# Patient Record
Sex: Female | Born: 1945 | Race: White | Hispanic: No | Marital: Married | Smoking: Never smoker
Health system: Southern US, Community
[De-identification: ages and names within clinical notes are randomized; demographics above are authoritative.]

## PROBLEM LIST (undated history)

## (undated) DIAGNOSIS — Z800 Family history of malignant neoplasm of digestive organs: Secondary | ICD-10-CM

## (undated) DIAGNOSIS — M1990 Unspecified osteoarthritis, unspecified site: Secondary | ICD-10-CM

## (undated) DIAGNOSIS — T7840XA Allergy, unspecified, initial encounter: Secondary | ICD-10-CM

## (undated) DIAGNOSIS — F419 Anxiety disorder, unspecified: Secondary | ICD-10-CM

## (undated) DIAGNOSIS — I10 Essential (primary) hypertension: Secondary | ICD-10-CM

## (undated) DIAGNOSIS — K635 Polyp of colon: Secondary | ICD-10-CM

## (undated) DIAGNOSIS — K5792 Diverticulitis of intestine, part unspecified, without perforation or abscess without bleeding: Secondary | ICD-10-CM

## (undated) DIAGNOSIS — G4700 Insomnia, unspecified: Secondary | ICD-10-CM

## (undated) HISTORY — DX: Family history of malignant neoplasm of digestive organs: Z80.0

## (undated) HISTORY — PX: TUBAL LIGATION: SHX77

## (undated) HISTORY — PX: COLONOSCOPY: SHX174

## (undated) HISTORY — DX: Diverticulitis of intestine, part unspecified, without perforation or abscess without bleeding: K57.92

## (undated) HISTORY — DX: Polyp of colon: K63.5

## (undated) HISTORY — DX: Allergy, unspecified, initial encounter: T78.40XA

## (undated) HISTORY — DX: Unspecified osteoarthritis, unspecified site: M19.90

---

## 1999-06-24 DIAGNOSIS — K635 Polyp of colon: Secondary | ICD-10-CM

## 1999-06-24 HISTORY — DX: Polyp of colon: K63.5

## 2005-03-24 ENCOUNTER — Ambulatory Visit: Payer: Self-pay | Admitting: Internal Medicine

## 2006-04-09 ENCOUNTER — Ambulatory Visit: Payer: Self-pay | Admitting: Internal Medicine

## 2006-05-07 ENCOUNTER — Ambulatory Visit: Payer: Self-pay | Admitting: Internal Medicine

## 2007-06-24 DIAGNOSIS — K5792 Diverticulitis of intestine, part unspecified, without perforation or abscess without bleeding: Secondary | ICD-10-CM

## 2007-06-24 HISTORY — DX: Diverticulitis of intestine, part unspecified, without perforation or abscess without bleeding: K57.92

## 2007-08-09 ENCOUNTER — Ambulatory Visit: Payer: Self-pay | Admitting: Orthopedic Surgery

## 2007-09-07 ENCOUNTER — Ambulatory Visit: Payer: Self-pay | Admitting: Internal Medicine

## 2007-09-23 ENCOUNTER — Ambulatory Visit: Payer: Self-pay | Admitting: General Surgery

## 2008-09-12 ENCOUNTER — Ambulatory Visit: Payer: Self-pay | Admitting: Internal Medicine

## 2009-10-11 ENCOUNTER — Ambulatory Visit: Payer: Self-pay | Admitting: Internal Medicine

## 2010-10-22 ENCOUNTER — Ambulatory Visit: Payer: Self-pay | Admitting: Internal Medicine

## 2011-12-18 ENCOUNTER — Ambulatory Visit: Payer: Self-pay | Admitting: Internal Medicine

## 2012-11-30 ENCOUNTER — Encounter: Payer: Self-pay | Admitting: General Surgery

## 2012-12-06 ENCOUNTER — Ambulatory Visit (INDEPENDENT_AMBULATORY_CARE_PROVIDER_SITE_OTHER): Payer: Medicare Other | Admitting: General Surgery

## 2012-12-06 ENCOUNTER — Encounter: Payer: Self-pay | Admitting: General Surgery

## 2012-12-06 VITALS — BP 140/80 | HR 74 | Resp 14 | Ht 63.0 in | Wt 207.0 lb

## 2012-12-06 DIAGNOSIS — Z1211 Encounter for screening for malignant neoplasm of colon: Secondary | ICD-10-CM

## 2012-12-06 DIAGNOSIS — Z800 Family history of malignant neoplasm of digestive organs: Secondary | ICD-10-CM

## 2012-12-06 MED ORDER — POLYETHYLENE GLYCOL 3350 17 GM/SCOOP PO POWD: ORAL | Status: DC

## 2012-12-06 NOTE — Patient Instructions (Addendum)
Colonoscopy A colonoscopy is an exam to evaluate your entire colon. In this exam, your colon is cleansed. A long fiberoptic tube is inserted through your rectum and into your colon. The fiberoptic scope (endoscope) is a long bundle of enclosed and very flexible fibers. These fibers transmit light to the area examined and send images from that area to your caregiver. Discomfort is usually minimal. You may be given a drug to help you sleep (sedative) during or prior to the procedure. This exam helps to detect lumps (tumors), polyps, inflammation, and areas of bleeding. Your caregiver may also take a small piece of tissue (biopsy) that will be examined under a microscope. LET YOUR CAREGIVER KNOW ABOUT:   Allergies to food or medicine.  Medicines taken, including vitamins, herbs, eyedrops, over-the-counter medicines, and creams.  Use of steroids (by mouth or creams).  Previous problems with anesthetics or numbing medicines.  History of bleeding problems or blood clots.  Previous surgery.  Other health problems, including diabetes and kidney problems.  Possibility of pregnancy, if this applies. BEFORE THE PROCEDURE   A clear liquid diet may be required for 2 days before the exam.  Ask your caregiver about changing or stopping your regular medications.  Liquid injections (enemas) or laxatives may be required.  A large amount of electrolyte solution may be given to you to drink over a short period of time. This solution is used to clean out your colon.  You should be present 60 minutes prior to your procedure or as directed by your caregiver. AFTER THE PROCEDURE   If you received a sedative or pain relieving medication, you will need to arrange for someone to drive you home.  Occasionally, there is a little blood passed with the first bowel movement. Do not be concerned. FINDING OUT THE RESULTS OF YOUR TEST Not all test results are available during your visit. If your test results are  not back during the visit, make an appointment with your caregiver to find out the results. Do not assume everything is normal if you have not heard from your caregiver or the medical facility. It is important for you to follow up on all of your test results. HOME CARE INSTRUCTIONS   It is not unusual to pass moderate amounts of gas and experience mild abdominal cramping following the procedure. This is due to air being used to inflate your colon during the exam. Walking or a warm pack on your belly (abdomen) may help.  You may resume all normal meals and activities after sedatives and medicines have worn off.  Only take over-the-counter or prescription medicines for pain, discomfort, or fever as directed by your caregiver. Do not use aspirin or blood thinners if a biopsy was taken. Consult your caregiver for medicine usage if biopsies were taken. SEEK IMMEDIATE MEDICAL CARE IF:   You have a fever.  You pass large blood clots or fill a toilet with blood following the procedure. This may also occur 10 to 14 days following the procedure. This is more likely if a biopsy was taken.  You develop abdominal pain that keeps getting worse and cannot be relieved with medicine. Document Released: 06/06/2000 Document Revised: 09/01/2011 Document Reviewed: 01/20/2008 Exit[employer] Patient Information 2014 [employer].  Patient has been scheduled for a colonoscopy on 12-29-12 at [facility]. 

## 2012-12-06 NOTE — Progress Notes (Signed)
Patient ID: Michele Dean, female   DOB: 1946-04-24, 67 y.o.   MRN: [id number]  Chief Complaint  Patient presents with  . Other    evaluation for colonoscopy    HPI Michele Dean is a 67 y.o. female who presents for an evaluation for a colonoscopy. The patient's mother has a history of colon cancer. The patient's last colonoscopy was done in 2009 which showed diverticula of the sigmoid colon.  HPI  Past Medical History  Diagnosis Date  . Allergy   . Arthritis   . Diverticulitis 2009  . Family history of colon cancer     mother    Past Surgical History  Procedure Laterality Date  . Colonoscopy  2009    Family History  Problem Relation Age of Onset  . Cancer Mother     colon    Social History History  Substance Use Topics  . Smoking status: Never Smoker   . Smokeless tobacco: Never Used  . Alcohol Use: Yes    Not on File  Current Outpatient Prescriptions  Medication Sig Dispense Refill  . polyethylene glycol powder (GLYCOLAX/MIRALAX) powder 255 grams one bottle for colonoscopy prep  255 g  0  . sertraline (ZOLOFT) 50 MG tablet Take 1 tablet by mouth daily.       No current facility-administered medications for this visit.    Review of Systems Review of Systems  Constitutional: Negative.   Respiratory: Negative.   Cardiovascular: Negative.   Gastrointestinal: Negative.     Blood pressure 140/80, pulse 74, resp. rate 14, height 5\' 3"  (1.6 m), weight 207 lb (93.895 kg).  Physical Exam Physical Exam  Constitutional: She appears well-developed and well-nourished.  Cardiovascular: Normal rate, regular rhythm and normal heart sounds.   Pulmonary/Chest: Effort normal and breath sounds normal.  Abdominal: Bowel sounds are normal.  Lymphadenopathy:    She has no cervical adenopathy.  Neurological: She is alert.    Data Reviewed None    Assessment    Family history colon cancer      Plan    Colonoscopy     Patient has been scheduled for a colonoscopy  on 12-29-12 at [facility].   Michele Dean 12/07/2012, 9:04 PM

## 2012-12-07 ENCOUNTER — Encounter: Payer: Self-pay | Admitting: General Surgery

## 2012-12-07 ENCOUNTER — Other Ambulatory Visit: Payer: Self-pay | Admitting: General Surgery

## 2012-12-07 DIAGNOSIS — Z86010 Personal history of colonic polyps: Secondary | ICD-10-CM

## 2012-12-07 DIAGNOSIS — Z1211 Encounter for screening for malignant neoplasm of colon: Secondary | ICD-10-CM | POA: Insufficient documentation

## 2012-12-07 NOTE — Progress Notes (Signed)
Patient ID: Michele Dean, female   DOB: [birth year], 67 y.o.   MRN: [id number]  No chief complaint on file.   HPI Michele Dean is a 67 y.o. female returning for a followup colonoscopy.  HPI Comments: The patient is following up in regards to a colonoscopy do a personal history of adenomatous polyps and her mother's history of colon cancer.   Past Medical History  Diagnosis Date  . Allergy   . Arthritis   . Diverticulitis 2009  . Family history of colon cancer     mother    Past Surgical History  Procedure Laterality Date  . Colonoscopy  2009    Family History  Problem Relation Age of Onset  . Cancer Mother     colon    Social History History  Substance Use Topics  . Smoking status: Never Smoker   . Smokeless tobacco: Never Used  . Alcohol Use: Yes    Not on File  Current Outpatient Prescriptions  Medication Sig Dispense Refill  . polyethylene glycol powder (GLYCOLAX/MIRALAX) powder 255 grams one bottle for colonoscopy prep  255 g  0  . sertraline (ZOLOFT) 50 MG tablet Take 1 tablet by mouth daily.       No current facility-administered medications for this visit.    Review of Systems Review of Systems  Constitutional: Negative.   HENT: Negative.   Respiratory: Negative.   Cardiovascular: Negative.   Gastrointestinal: Negative.     There were no vitals taken for this visit.  Physical Exam Physical Exam  Constitutional: She appears well-developed and well-nourished.  Neck: Normal range of motion. Neck supple.  Cardiovascular: Normal rate and regular rhythm.   Pulmonary/Chest: Effort normal and breath sounds normal.    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  Assessment    Family history of colon cancer first degree relative.     Plan    Colonoscopy is scheduled for December 29, 2012. As she has had an incomplete exam in the past due to discomfort, examination under propofol anesthesia is felt  most appropriate.        Earline Mayotte 12/07/2012, 8:38 PM

## 2012-12-20 ENCOUNTER — Ambulatory Visit: Payer: Self-pay | Admitting: Internal Medicine

## 2012-12-21 ENCOUNTER — Telehealth: Payer: Self-pay | Admitting: *Deleted

## 2012-12-21 NOTE — Telephone Encounter (Signed)
Patient called wanting to reschedule colonoscopy from 12-29-12 to 02-09-13. Trish in endoscopy notified. Patient will be contacted prior to verify no medication changes.

## 2013-01-26 ENCOUNTER — Other Ambulatory Visit: Payer: Self-pay

## 2013-02-03 ENCOUNTER — Telehealth: Payer: Self-pay | Admitting: *Deleted

## 2013-02-03 NOTE — Telephone Encounter (Signed)
Patient reports no change in medications since last office visit. We will proceed with colonoscopy that is scheduled at [facility] for 02-09-13. She will call the office if she has further questions.

## 2013-02-07 ENCOUNTER — Telehealth: Payer: Self-pay | Admitting: *Deleted

## 2013-02-07 NOTE — Telephone Encounter (Signed)
Patient called to reschedule colonoscopy that was scheduled for 02-09-13 at [facility]. This has been rescheduled to 03-20-2013 due to a death in the family. Patient instructed she will not have to pre-register again.  Trish in endoscopy is aware of reschedule.

## 2013-02-17 ENCOUNTER — Telehealth: Payer: Self-pay | Admitting: *Deleted

## 2013-02-17 NOTE — Telephone Encounter (Signed)
Patient reports no change in medications. We will proceed with colonoscopy that is scheduled for 02-22-13 at [facility]. She will call the office if she has further questions.

## 2013-02-22 ENCOUNTER — Ambulatory Visit: Payer: Self-pay | Admitting: General Surgery

## 2013-02-22 DIAGNOSIS — D129 Benign neoplasm of anus and anal canal: Secondary | ICD-10-CM

## 2013-02-22 DIAGNOSIS — D128 Benign neoplasm of rectum: Secondary | ICD-10-CM

## 2013-02-23 ENCOUNTER — Encounter: Payer: Self-pay | Admitting: General Surgery

## 2013-03-10 ENCOUNTER — Encounter: Payer: Self-pay | Admitting: General Surgery

## 2013-04-28 ENCOUNTER — Other Ambulatory Visit: Payer: Self-pay

## 2013-07-17 ENCOUNTER — Emergency Department: Payer: Self-pay | Admitting: Emergency Medicine

## 2013-07-22 ENCOUNTER — Emergency Department: Payer: Self-pay | Admitting: Emergency Medicine

## 2013-07-22 LAB — COMPREHENSIVE METABOLIC PANEL
ANION GAP: 6 — AB (ref 7–16)
Albumin: 4.2 g/dL (ref 3.4–5.0)
Alkaline Phosphatase: 56 U/L
BUN: 16 mg/dL (ref 7–18)
Bilirubin,Total: 0.4 mg/dL (ref 0.2–1.0)
CALCIUM: 9.2 mg/dL (ref 8.5–10.1)
CO2: 29 mmol/L (ref 21–32)
Chloride: 102 mmol/L (ref 98–107)
Creatinine: 0.77 mg/dL (ref 0.60–1.30)
GLUCOSE: 103 mg/dL — AB (ref 65–99)
Osmolality: 275 (ref 275–301)
Potassium: 3.7 mmol/L (ref 3.5–5.1)
SGOT(AST): 31 U/L (ref 15–37)
SGPT (ALT): 28 U/L (ref 12–78)
Sodium: 137 mmol/L (ref 136–145)
TOTAL PROTEIN: 8 g/dL (ref 6.4–8.2)

## 2013-07-22 LAB — CBC
HCT: 44.1 % (ref 35.0–47.0)
HGB: 14.6 g/dL (ref 12.0–16.0)
MCH: 31.6 pg (ref 26.0–34.0)
MCHC: 33.1 g/dL (ref 32.0–36.0)
MCV: 95 fL (ref 80–100)
Platelet: 241 10*3/uL (ref 150–440)
RBC: 4.63 10*6/uL (ref 3.80–5.20)
RDW: 12.7 % (ref 11.5–14.5)
WBC: 7.4 10*3/uL (ref 3.6–11.0)

## 2013-07-22 LAB — TROPONIN I: Troponin-I: <0.02 ng/mL

## 2014-01-23 ENCOUNTER — Ambulatory Visit: Payer: Self-pay | Admitting: Internal Medicine

## 2014-02-01 ENCOUNTER — Ambulatory Visit: Payer: Self-pay | Admitting: Internal Medicine

## 2015-01-30 ENCOUNTER — Other Ambulatory Visit: Payer: Self-pay | Admitting: Internal Medicine

## 2015-01-30 DIAGNOSIS — Z1231 Encounter for screening mammogram for malignant neoplasm of breast: Secondary | ICD-10-CM

## 2015-02-02 ENCOUNTER — Ambulatory Visit
Admission: RE | Admit: 2015-02-02 | Discharge: 2015-02-02 | Disposition: A | Discharge status: Home / Self Care | Payer: Medicare Other | Source: Ambulatory Visit | Attending: Internal Medicine | Admitting: Internal Medicine

## 2015-02-02 DIAGNOSIS — Z1231 Encounter for screening mammogram for malignant neoplasm of breast: Secondary | ICD-10-CM | POA: Diagnosis not present

## 2015-02-05 ENCOUNTER — Other Ambulatory Visit: Payer: Self-pay | Admitting: Internal Medicine

## 2015-02-05 DIAGNOSIS — R921 Mammographic calcification found on diagnostic imaging of breast: Secondary | ICD-10-CM

## 2015-02-09 ENCOUNTER — Ambulatory Visit: Payer: Self-pay

## 2015-02-09 ENCOUNTER — Ambulatory Visit
Admission: RE | Admit: 2015-02-09 | Discharge: 2015-02-09 | Disposition: A | Discharge status: Home / Self Care | Payer: Medicare Other | Source: Ambulatory Visit | Attending: Internal Medicine | Admitting: Internal Medicine

## 2015-02-09 DIAGNOSIS — R920 Mammographic microcalcification found on diagnostic imaging of breast: Secondary | ICD-10-CM | POA: Diagnosis not present

## 2015-02-09 DIAGNOSIS — R921 Mammographic calcification found on diagnostic imaging of breast: Secondary | ICD-10-CM

## 2015-02-12 ENCOUNTER — Other Ambulatory Visit: Payer: Self-pay | Admitting: Internal Medicine

## 2015-02-12 DIAGNOSIS — R928 Other abnormal and inconclusive findings on diagnostic imaging of breast: Secondary | ICD-10-CM

## 2015-02-12 DIAGNOSIS — R921 Mammographic calcification found on diagnostic imaging of breast: Secondary | ICD-10-CM

## 2015-02-14 ENCOUNTER — Ambulatory Visit
Admission: RE | Admit: 2015-02-14 | Discharge: 2015-02-14 | Disposition: A | Discharge status: Home / Self Care | Payer: Medicare Other | Source: Ambulatory Visit | Attending: Internal Medicine | Admitting: Internal Medicine

## 2015-02-14 DIAGNOSIS — R928 Other abnormal and inconclusive findings on diagnostic imaging of breast: Secondary | ICD-10-CM

## 2015-02-14 DIAGNOSIS — R921 Mammographic calcification found on diagnostic imaging of breast: Secondary | ICD-10-CM

## 2015-02-14 DIAGNOSIS — N6091 Unspecified benign mammary dysplasia of right breast: Secondary | ICD-10-CM | POA: Insufficient documentation

## 2015-02-14 HISTORY — PX: BREAST SURGERY: SHX581

## 2015-02-16 LAB — SURGICAL PATHOLOGY

## 2015-02-21 ENCOUNTER — Ambulatory Visit (INDEPENDENT_AMBULATORY_CARE_PROVIDER_SITE_OTHER): Payer: Medicare Other | Admitting: General Surgery

## 2015-02-21 ENCOUNTER — Other Ambulatory Visit: Payer: Medicare Other

## 2015-02-21 ENCOUNTER — Encounter: Payer: Self-pay | Admitting: General Surgery

## 2015-02-21 VITALS — BP 152/64 | HR 70 | Resp 12 | Ht 62.0 in | Wt 174.0 lb

## 2015-02-21 DIAGNOSIS — N62 Hypertrophy of breast: Secondary | ICD-10-CM

## 2015-02-21 DIAGNOSIS — N6091 Unspecified benign mammary dysplasia of right breast: Secondary | ICD-10-CM

## 2015-02-21 NOTE — H&P (Signed)
Patient ID: Michele Dean, female   DOB: Aug 12, 1945, 68 y.o.   MRN: [id number]    Chief Complaint   Patient presents with   .  Breast Problem      HPI Michele Dean is a 69 y.o. female.  who presents for a breast evaluation. The most recent mammogram was done on 02-02-15.   Patient does perform regular self breast checks and gets regular mammograms done.   She could not feel anything different in the breast.  She had a stereotatic right breast biopsy on 02-14-15, showing atypical hyperplasia.     HPI    Past Medical History   Diagnosis  Date   .  Allergy     .  Arthritis     .  Diverticulitis  2009   .  Family history of colon cancer         mother       Past Surgical History   Procedure  Laterality  Date   .  Colonoscopy    2009, 2014       DR Alanea Woolridge   .  Breast surgery  Right  02-14-15       ATYPICAL DUCTAL HYPERPLASIA        Family History   Problem  Relation  Age of Onset   .  Cancer  Mother  4       colon   .  Breast cancer  Maternal Aunt         ? age mid 78's      Social History Social History   Substance Use Topics   .  Smoking status:  Never Smoker    .  Smokeless tobacco:  Never Used   .  Alcohol Use:  Yes      No Known Allergies    Current Outpatient Prescriptions   Medication  Sig  Dispense  Refill   .  hydrochlorothiazide (HYDRODIURIL) 25 MG tablet  Take 25 mg by mouth daily.     0   .  sertraline (ZOLOFT) 50 MG tablet  Take 1 tablet by mouth daily.           No current facility-administered medications for this visit.      Review of Systems Review of Systems  Constitutional: Negative.   Respiratory: Negative.   Cardiovascular: Negative.     Blood pressure 152/64, pulse 70, resp. rate 12, height 5\' 2"  (1.575 m), weight 174 lb (78.926 kg).   Physical Exam Physical Exam  Constitutional: She is oriented to person, place, and time. She appears well-developed and well-nourished.  HENT:   Mouth/Throat: Oropharynx is clear and  moist.  Eyes: Conjunctivae are normal. No scleral icterus.  Neck: Neck supple.  Cardiovascular: Normal rate, regular rhythm and normal heart sounds.   Pulmonary/Chest: Effort normal and breath sounds normal. Right breast exhibits skin change. Right breast exhibits no inverted nipple, no mass, no nipple discharge and no tenderness. Left breast exhibits no inverted nipple, no mass, no nipple discharge, no skin change and no tenderness.  Minimal bruising at 11 o'clock right breast biopsy site.  Lymphadenopathy:    She has no cervical adenopathy.    She has no axillary adenopathy.  Neurological: She is alert and oriented to person, place, and time.  Skin: Skin is warm and dry.  Psychiatric: Her behavior is normal.    Data Reviewed August 12, 19 and 24, 2016 mammograms pre-and postbiopsy were reviewed.     DIAGNOSIS:  A. RIGHT  BREAST, UPPER OUTER QUADRANT; STEREOTACTIC CORE BIOPSY:  - ATYPICAL DUCTAL HYPERPLASIA ASSOCIATED WITH CALCIFICATIONS.   Comment:  Additional deeper sections were reviewed on block A3, representing the  tissue containing the calcifications of concern. There is a single focus  of atypical ductal hyperplasia (ADH) measuring up to 1.75 mm, and there  are correlating luminal calcifications within the ADH.   This is a single small focus, and there should be a careful evaluation  of the patient's risk factors and imaging findings to assess the need  for excision of the site. I note that the post-procedure mammogram shows  about 2.5 cm of superior clip migration from the expected location of  the biopsied calcifications.    Ultrasound examination of the upper outer quadrant of the right breast was completed to determine if the biopsy site was visible. This was not the case. No images, no charge.   Assessment Less than 2 mm foci ADH.   Plan Indication for reexcision to assess for upstaging to DCIS/invasive cancer were reviewed. The area is not visible on  ultrasound, and there is likely some discordance between the biopsy site and final clip location. Needle localization will be of benefit.       Patient's surgery has been scheduled for 03-20-15 at [facility].   PCP:  Irven Easterly 02/21/2015, 4:21 PM

## 2015-02-21 NOTE — Progress Notes (Signed)
Patient ID: Michele Dean, female   DOB: 06-Apr-1946, 69 y.o.   MRN: [id number]  Chief Complaint  Patient presents with  . Breast Problem    HPI Michele Dean is a 69 y.o. female.  who presents for a breast evaluation. The most recent mammogram was done on 02-02-15.  Patient does perform regular self breast checks and gets regular mammograms done.  She could not feel anything different in the breast.  She had a stereotatic right breast biopsy on 02-14-15, showing atypical hyperplasia.   HPI  Past Medical History  Diagnosis Date  . Allergy   . Arthritis   . Diverticulitis 2009  . Family history of colon cancer     mother    Past Surgical History  Procedure Laterality Date  . Colonoscopy  2009, 2014    DR Prathik Aman  . Breast surgery Right 02-14-15    ATYPICAL DUCTAL HYPERPLASIA     Family History  Problem Relation Age of Onset  . Cancer Mother 4    colon  . Breast cancer Maternal Aunt     ? age mid 63's    Social History Social History  Substance Use Topics  . Smoking status: Never Smoker   . Smokeless tobacco: Never Used  . Alcohol Use: Yes    No Known Allergies  Current Outpatient Prescriptions  Medication Sig Dispense Refill  . hydrochlorothiazide (HYDRODIURIL) 25 MG tablet Take 25 mg by mouth daily.   0  . sertraline (ZOLOFT) 50 MG tablet Take 1 tablet by mouth daily.     No current facility-administered medications for this visit.    Review of Systems Review of Systems  Constitutional: Negative.   Respiratory: Negative.   Cardiovascular: Negative.     Blood pressure 152/64, pulse 70, resp. rate 12, height 5\' 2"  (1.575 m), weight 174 lb (78.926 kg).  Physical Exam Physical Exam  Constitutional: She is oriented to person, place, and time. She appears well-developed and well-nourished.  HENT:  Mouth/Throat: Oropharynx is clear and moist.  Eyes: Conjunctivae are normal. No scleral icterus.  Neck: Neck supple.  Cardiovascular: Normal rate, regular  rhythm and normal heart sounds.   Pulmonary/Chest: Effort normal and breath sounds normal. Right breast exhibits skin change. Right breast exhibits no inverted nipple, no mass, no nipple discharge and no tenderness. Left breast exhibits no inverted nipple, no mass, no nipple discharge, no skin change and no tenderness.  Minimal bruising at 11 o'clock right breast biopsy site.  Lymphadenopathy:    She has no cervical adenopathy.    She has no axillary adenopathy.  Neurological: She is alert and oriented to person, place, and time.  Skin: Skin is warm and dry.  Psychiatric: Her behavior is normal.    Data Reviewed August 12, 19 and 24, 2016 mammograms pre-and postbiopsy were reviewed.   DIAGNOSIS:  A. RIGHT BREAST, UPPER OUTER QUADRANT; STEREOTACTIC CORE BIOPSY:  - ATYPICAL DUCTAL HYPERPLASIA ASSOCIATED WITH CALCIFICATIONS.   Comment:  Additional deeper sections were reviewed on block A3, representing the  tissue containing the calcifications of concern. There is a single focus  of atypical ductal hyperplasia (ADH) measuring up to 1.75 mm, and there  are correlating luminal calcifications within the ADH.   This is a single small focus, and there should be a careful evaluation  of the patient's risk factors and imaging findings to assess the need  for excision of the site. I note that the post-procedure mammogram shows  about 2.5 cm of superior clip  migration from the expected location of  the biopsied calcifications.   Ultrasound examination of the upper outer quadrant of the right breast was completed to determine if the biopsy site was visible. This was not the case. No images, no charge.  Assessment    Less than 2 mm foci ADH.    Plan    Indication for reexcision to assess for upstaging to DCIS/invasive cancer were reviewed. The area is not visible on ultrasound, and there is likely some discordance between the biopsy site and final clip location. Needle localization will be  of benefit.       Patient's surgery has been scheduled for 03-20-15 at [facility].  PCP:  Irven Easterly 02/21/2015, 4:21 PM

## 2015-02-21 NOTE — Patient Instructions (Addendum)
The patient is aware to call back for any questions or concerns.  Patient's surgery has been scheduled for 03-20-15 at [facility].

## 2015-02-22 ENCOUNTER — Telehealth: Payer: Self-pay | Admitting: *Deleted

## 2015-02-22 ENCOUNTER — Other Ambulatory Visit: Payer: Self-pay | Admitting: General Surgery

## 2015-02-22 DIAGNOSIS — N6099 Unspecified benign mammary dysplasia of unspecified breast: Secondary | ICD-10-CM

## 2015-02-22 HISTORY — PX: BREAST EXCISIONAL BIOPSY: SUR124

## 2015-02-22 NOTE — Telephone Encounter (Signed)
Message for patient to call the office.   We need to inform patient that she needs to arrive at the [facility] at 8:40 am the day of surgery.   Patient is presently scheduled for surgery on 03-20-15.

## 2015-02-22 NOTE — Telephone Encounter (Signed)
Patient notified of pre-admit and surgery date/times. She verbalizes understanding.   This patient was instructed to call the office should she have further questions.

## 2015-03-07 ENCOUNTER — Encounter
Admission: RE | Admit: 2015-03-07 | Discharge: 2015-03-07 | Disposition: A | Discharge status: Home / Self Care | Payer: Medicare Other | Source: Ambulatory Visit | Attending: General Surgery | Admitting: General Surgery

## 2015-03-07 DIAGNOSIS — Z01810 Encounter for preprocedural cardiovascular examination: Secondary | ICD-10-CM | POA: Insufficient documentation

## 2015-03-07 DIAGNOSIS — Z01812 Encounter for preprocedural laboratory examination: Secondary | ICD-10-CM | POA: Diagnosis present

## 2015-03-07 HISTORY — DX: Anxiety disorder, unspecified: F41.9

## 2015-03-07 LAB — POTASSIUM: POTASSIUM: 4.3 mmol/L (ref 3.5–5.1)

## 2015-03-07 NOTE — Patient Instructions (Signed)
  Your procedure is scheduled on: March 20, 2015 (Tuesday) Report to Mammography.([place]) To find out your arrival time please call [phone] between 1PM - 3PM on (Arrival Time 8:30).  Remember: Instructions that are not followed completely may result in serious medical risk, up to and including death, or upon the discretion of your surgeon and anesthesiologist your surgery may need to be rescheduled.    __x__ 1. Do not eat food or drink liquids after midnight. No gum chewing or hard candies.     __x__ 2. No Alcohol for 24 hours before or after surgery.   ____ 3. Bring all medications with you on the day of surgery if instructed.    __x__ 4. Notify your doctor if there is any change in your medical condition     (cold, fever, infections).     Do not wear jewelry, make-up, hairpins, clips or nail polish.  Do not wear lotions, powders, or perfumes. You may wear deodorant.  Do not shave 48 hours prior to surgery. Men may shave face and neck.  Do not bring valuables to the hospital.    [facility] is not responsible for any belongings or valuables.               Contacts, dentures or bridgework may not be worn into surgery.  Leave your suitcase in the car. After surgery it may be brought to your room.  For patients admitted to the hospital, discharge time is determined by your                treatment team.   Patients discharged the day of surgery will not be allowed to drive home.   Please read over the following fact sheets that you were given:   Surgical Site Infection Prevention   ____ Take these medicines the morning of surgery with A SIP OF WATER:    1. Zoloft  2.   3.   4.  5.  6.  ____ Fleet Enema (as directed)   __x__ Use CHG Soap as directed  ____ Use inhalers on the day of surgery  ____ Stop metformin 2 days prior to surgery    ____ Take 1/2 of usual insulin dose the night before surgery and none on the morning of surgery.   ____ Stop  Coumadin/Plavix/aspirin on   ____ Stop Anti-inflammatories on (Tylenol ok to take for pain)   _x___ Stop supplements until after surgery.  (Stop Prelief)  ____ Bring C-Pap to the hospital.

## 2015-03-08 NOTE — Pre-Procedure Instructions (Signed)
EKG to anesthesia for review. 

## 2015-03-12 NOTE — Pre-Procedure Instructions (Signed)
EKG reviewed by Dr. Thomas and "ok for surgery"  

## 2015-03-19 ENCOUNTER — Telehealth: Payer: Self-pay | Admitting: *Deleted

## 2015-03-19 NOTE — Telephone Encounter (Signed)
Patient called the office back to report that Dr. Juanell Fairly sent off a culture to determine if patient has a bladder infection.   This patient has been placed on Cipro 500 mg one PO bid. Patient was instructed to take the medication when she picks up today and one later this evening. Patient instructed to call the pre-admission department to verify that it would be okay to take antibiotic in the morning as well.  We will proceed with surgery as scheduled for 03-20-15 at [facility].

## 2015-03-20 ENCOUNTER — Encounter: Payer: Self-pay | Admitting: *Deleted

## 2015-03-20 ENCOUNTER — Ambulatory Visit: Payer: Medicare Other | Admitting: Anesthesiology

## 2015-03-20 ENCOUNTER — Ambulatory Visit: Payer: Medicare Other

## 2015-03-20 ENCOUNTER — Ambulatory Visit
Admission: RE | Admit: 2015-03-20 | Discharge: 2015-03-20 | Disposition: A | Discharge status: Home / Self Care | Payer: Medicare Other | Source: Ambulatory Visit | Attending: General Surgery | Admitting: General Surgery

## 2015-03-20 ENCOUNTER — Encounter
Admission: RE | Disposition: A | Discharge status: Home / Self Care | Payer: Self-pay | Source: Ambulatory Visit | Attending: General Surgery

## 2015-03-20 DIAGNOSIS — Z800 Family history of malignant neoplasm of digestive organs: Secondary | ICD-10-CM | POA: Insufficient documentation

## 2015-03-20 DIAGNOSIS — M1990 Unspecified osteoarthritis, unspecified site: Secondary | ICD-10-CM | POA: Diagnosis not present

## 2015-03-20 DIAGNOSIS — N6091 Unspecified benign mammary dysplasia of right breast: Secondary | ICD-10-CM | POA: Insufficient documentation

## 2015-03-20 DIAGNOSIS — N6099 Unspecified benign mammary dysplasia of unspecified breast: Secondary | ICD-10-CM

## 2015-03-20 DIAGNOSIS — N62 Hypertrophy of breast: Secondary | ICD-10-CM | POA: Diagnosis present

## 2015-03-20 DIAGNOSIS — Z803 Family history of malignant neoplasm of breast: Secondary | ICD-10-CM | POA: Diagnosis not present

## 2015-03-20 DIAGNOSIS — Z79899 Other long term (current) drug therapy: Secondary | ICD-10-CM | POA: Insufficient documentation

## 2015-03-20 DIAGNOSIS — I10 Essential (primary) hypertension: Secondary | ICD-10-CM | POA: Insufficient documentation

## 2015-03-20 DIAGNOSIS — G4700 Insomnia, unspecified: Secondary | ICD-10-CM | POA: Insufficient documentation

## 2015-03-20 HISTORY — PX: BREAST LUMPECTOMY W/ NEEDLE LOCALIZATION: SHX1266

## 2015-03-20 HISTORY — DX: Essential (primary) hypertension: I10

## 2015-03-20 HISTORY — PX: BREAST BIOPSY: SHX20

## 2015-03-20 HISTORY — DX: Insomnia, unspecified: G47.00

## 2015-03-20 SURGERY — BREAST BIOPSY WITH NEEDLE LOCALIZATION
Anesthesia: General | Laterality: Right

## 2015-03-20 MED ORDER — PROPOFOL 10 MG/ML IV BOLUS
INTRAVENOUS | Status: DC | PRN
  Administered 2015-03-20: 150 mg via INTRAVENOUS

## 2015-03-20 MED ORDER — OXYCODONE HCL 5 MG PO TABS: 5.0000 mg | ORAL_TABLET | Freq: Once | ORAL | Status: DC | PRN

## 2015-03-20 MED ORDER — BUPIVACAINE-EPINEPHRINE (PF) 0.5% -1:200000 IJ SOLN
INTRAMUSCULAR | Status: DC | PRN
  Administered 2015-03-20: 30 mL

## 2015-03-20 MED ORDER — ACETAMINOPHEN 10 MG/ML IV SOLN
INTRAVENOUS | Status: AC
  Filled 2015-03-20: qty 100

## 2015-03-20 MED ORDER — LIDOCAINE HCL (CARDIAC) 20 MG/ML IV SOLN
INTRAVENOUS | Status: DC | PRN
  Administered 2015-03-20: 80 mg via INTRAVENOUS

## 2015-03-20 MED ORDER — DEXAMETHASONE SODIUM PHOSPHATE 10 MG/ML IJ SOLN
INTRAMUSCULAR | Status: DC | PRN
Start: 2015-03-20 — End: 2015-03-20
  Administered 2015-03-20: 8 mg via INTRAVENOUS

## 2015-03-20 MED ORDER — HYDROCODONE-ACETAMINOPHEN 5-325 MG PO TABS: 1.0000 | ORAL_TABLET | ORAL | Status: DC | PRN

## 2015-03-20 MED ORDER — FENTANYL CITRATE (PF) 100 MCG/2ML IJ SOLN
INTRAMUSCULAR | Status: DC | PRN
  Administered 2015-03-20: 50 ug via INTRAVENOUS

## 2015-03-20 MED ORDER — ONDANSETRON HCL 4 MG/2ML IJ SOLN
INTRAMUSCULAR | Status: DC | PRN
  Administered 2015-03-20: 4 mg via INTRAVENOUS

## 2015-03-20 MED ORDER — OXYCODONE HCL 5 MG/5ML PO SOLN: 5.0000 mg | Freq: Once | ORAL | Status: DC | PRN

## 2015-03-20 MED ORDER — ACETAMINOPHEN 10 MG/ML IV SOLN
INTRAVENOUS | Status: DC | PRN
  Administered 2015-03-20: 1000 mg via INTRAVENOUS

## 2015-03-20 MED ORDER — FENTANYL CITRATE (PF) 100 MCG/2ML IJ SOLN: 25.0000 ug | INTRAMUSCULAR | Status: DC | PRN

## 2015-03-20 MED ORDER — MIDAZOLAM HCL 5 MG/5ML IJ SOLN
INTRAMUSCULAR | Status: DC | PRN
  Administered 2015-03-20: 2 mg via INTRAVENOUS

## 2015-03-20 MED ORDER — BUPIVACAINE-EPINEPHRINE (PF) 0.5% -1:200000 IJ SOLN
INTRAMUSCULAR | Status: AC
  Filled 2015-03-20: qty 30

## 2015-03-20 MED ORDER — LACTATED RINGERS IV SOLN
INTRAVENOUS | Status: DC
  Administered 2015-03-20: 10:00:00 via INTRAVENOUS

## 2015-03-20 MED ORDER — FAMOTIDINE 20 MG PO TABS
ORAL_TABLET | ORAL | Status: AC
  Administered 2015-03-20: 20 mg via ORAL
  Filled 2015-03-20: qty 1

## 2015-03-20 MED ORDER — FAMOTIDINE 20 MG PO TABS
20.0000 mg | ORAL_TABLET | Freq: Once | ORAL | Status: AC
  Administered 2015-03-20: 20 mg via ORAL

## 2015-03-20 SURGICAL SUPPLY — 38 items
BANDAGE ELASTIC 6 CLIP NS LF (GAUZE/BANDAGES/DRESSINGS) IMPLANT
BLADE SURG 15 STRL SS SAFETY (BLADE) ×3 IMPLANT
BNDG GAUZE 4.5X4.1 6PLY STRL (MISCELLANEOUS) ×3 IMPLANT
CANISTER SUCT 1200ML W/VALVE (MISCELLANEOUS) ×3 IMPLANT
CHLORAPREP W/TINT 26ML (MISCELLANEOUS) ×3 IMPLANT
CLIP TI MEDIUM 6 (CLIP) ×3 IMPLANT
CLOSURE WOUND 1/2 X4 (GAUZE/BANDAGES/DRESSINGS) ×1
CNTNR SPEC 2.5X3XGRAD LEK (MISCELLANEOUS) ×1
CONT SPEC 4OZ STER OR WHT (MISCELLANEOUS) ×2
CONTAINER SPEC 2.5X3XGRAD LEK (MISCELLANEOUS) ×1 IMPLANT
COVER PROBE FLX POLY STRL (MISCELLANEOUS) ×3 IMPLANT
DEVICE DUBIN SPECIMEN MAMMOGRA (MISCELLANEOUS) ×3 IMPLANT
DRAPE LAPAROTOMY 100X77 ABD (DRAPES) ×3 IMPLANT
DRESSING TELFA 4X3 1S ST N-ADH (GAUZE/BANDAGES/DRESSINGS) ×6 IMPLANT
ELECT CAUTERY BLADE TIP 2.5 (TIP) ×3
ELECTRODE CAUTERY BLDE TIP 2.5 (TIP) ×1 IMPLANT
GAUZE FLUFF 18X24 1PLY STRL (GAUZE/BANDAGES/DRESSINGS) ×3 IMPLANT
GLOVE BIO SURGEON STRL SZ7.5 (GLOVE) ×9 IMPLANT
GLOVE INDICATOR 8.0 STRL GRN (GLOVE) ×9 IMPLANT
GOWN STRL REUS W/ TWL LRG LVL3 (GOWN DISPOSABLE) ×3 IMPLANT
GOWN STRL REUS W/TWL LRG LVL3 (GOWN DISPOSABLE) ×6
KIT RM TURNOVER STRD PROC AR (KITS) ×3 IMPLANT
LABEL OR SOLS (LABEL) ×3 IMPLANT
MARGIN MAP 10MM (MISCELLANEOUS) ×3 IMPLANT
NDL SAFETY 22GX1.5 (NEEDLE) ×3 IMPLANT
NEEDLE HYPO 25X1 1.5 SAFETY (NEEDLE) ×3 IMPLANT
PACK BASIN MINOR ARMC (MISCELLANEOUS) ×3 IMPLANT
PAD GROUND ADULT SPLIT (MISCELLANEOUS) ×3 IMPLANT
STRIP CLOSURE SKIN 1/2X4 (GAUZE/BANDAGES/DRESSINGS) ×2 IMPLANT
SUT ETHILON 3-0 FS-10 30 BLK (SUTURE) ×3
SUT VIC AB 2-0 CT1 27 (SUTURE) ×2
SUT VIC AB 2-0 CT1 TAPERPNT 27 (SUTURE) ×1 IMPLANT
SUT VIC AB 4-0 FS2 27 (SUTURE) ×3 IMPLANT
SUTURE EHLN 3-0 FS-10 30 BLK (SUTURE) ×1 IMPLANT
SWABSTK COMLB BENZOIN TINCTURE (MISCELLANEOUS) ×3 IMPLANT
SYR CONTROL 10ML (SYRINGE) ×3 IMPLANT
TAPE TRANSPORE STRL 2 31045 (GAUZE/BANDAGES/DRESSINGS) ×3 IMPLANT
WATER STERILE IRR 1000ML POUR (IV SOLUTION) ×3 IMPLANT

## 2015-03-20 NOTE — Anesthesia Postprocedure Evaluation (Signed)
  Anesthesia Post-op Note  Patient: Michele Dean  Procedure(s) Performed: Procedure(s): BREAST BIOPSY WITH NEEDLE LOCALIZATION (Right)  Anesthesia type:General LMA  Patient location: PACU  Post pain: Pain level controlled  Post assessment: Post-op Vital signs reviewed, Patient's Cardiovascular Status Stable, Respiratory Function Stable, Patent Airway and No signs of Nausea or vomiting  Post vital signs: Reviewed and stable  Last Vitals:  Filed Vitals:   03/20/15 1317  BP: 138/62  Pulse: 86  Temp:   Resp: 16    Level of consciousness: awake, alert  and patient cooperative  Complications: No apparent anesthesia complications

## 2015-03-20 NOTE — Progress Notes (Signed)
   03/20/15 1230  Clinical Encounter Type  Visited With Patient and family together  Visit Type Initial  Provided pastoral presence and support to patient and family member in same day surgery.  [facility] [phone]

## 2015-03-20 NOTE — H&P (Signed)
No change in clinical condition. Case reviewed w/ Dr. Brigitte Pulse from radiology re: clip vs biopsy site discrepancy. For right breast biopsy.

## 2015-03-20 NOTE — Op Note (Signed)
Preoperative diagnosis: ADH right breast.  Postoperative diagnosis: same.  Operative procedure: Excision right upper outer quadrant of the breast with wire localization ultrasound guidance.  Operating surgeon: Hervey Ard, M.D.  Anesthesia: Gen. By LMA, Marcaine 0.5% with 1-200,000 epinephrine, 30 mL local infiltration.  Estimated blood loss: Less than 5 mL.   Clinical note this 69 year old woman had an area of microcalcifications and biopsy showed evidence of ADH. She's brought the operative for planned formal excision. Needle localization was completed by the radiology service the morning of the procedure with special notation made that the clip placed at the time of her original stereotactic biopsy no longer corresponded anatomically to the original site of calcifications.  Operative note:  The films were reviewed electronically with the radiologist prior to the procedure. After the induction of general anesthesia the breast was carefully prepped with chlor prep and drape. Sounds used to confirm location of the wire within the substance of the upper-outer quadrant. A curvilinear incision in the upper-outer quadrant was made from the 10 to 12:00 position. Skin was incised sharply and the remaining dissection completed electrocautery. Prior to skin incision Marcaine was infiltrated for postoperative analgesia. The localizing wire was brought into the field and the previously placed biopsy clip was identified approximately 2 cm proximal to the anticipated site of original biopsy. A 3 x 3 x 4 cm block of tissue was excised, orientated and sent for specimen radiograph. This confirmed inclusion of the complete tip the wire in the area of suspicious density at the old biopsy site.  The breast was elevated off the underlying pectoralis muscle circumferentially and interrupted 2-0 Vicryls sutures were used to approximate this area. The adipose tissue was approximated with interrupted 2-0 Vicryls  sutures. Skin flaps were freed superiorly about 3 cm to allow smooth approximation with a running 4-0 Vicryls septic suture. Benzoin, Steri-Strips and Telfa dressing applied. Fluff gauze, Kerlix and Ace wrap placed.  The patient tolerated the procedure well was brought to the  Recovery room in good condition.

## 2015-03-20 NOTE — Transfer of Care (Signed)
Immediate Anesthesia Transfer of Care Note  Patient: Michele Dean  Procedure(s) Performed: Procedure(s): BREAST BIOPSY WITH NEEDLE LOCALIZATION (Right)  Patient Location: PACU  Anesthesia Type:General  Level of Consciousness: sedated  Airway & Oxygen Therapy: Patient Spontanous Breathing and Patient connected to face mask oxygen  Post-op Assessment: Report given to RN and Post -op Vital signs reviewed and stable  Post vital signs: Reviewed and stable  Last Vitals:  Filed Vitals:   03/20/15 1140  BP:   Pulse:   Temp: 36.3 C  Resp:     Complications: No apparent anesthesia complications

## 2015-03-20 NOTE — Anesthesia Preprocedure Evaluation (Signed)
Anesthesia Evaluation  Patient identified by MRN, date of birth, ID band Patient awake    Reviewed: Allergy & Precautions, H&P , NPO status , Patient's Chart, lab work & pertinent test results  Airway Mallampati: II  TM Distance: >3 FB Neck ROM: full    Dental no notable dental hx. (+) Teeth Intact   Pulmonary neg pulmonary ROS, neg shortness of breath,    Pulmonary exam normal breath sounds clear to auscultation       Cardiovascular Exercise Tolerance: Good hypertension, (-) Past MI Normal cardiovascular exam Rhythm:regular Rate:Normal     Neuro/Psych PSYCHIATRIC DISORDERS Anxiety negative neurological ROS     GI/Hepatic negative GI ROS, Neg liver ROS,   Endo/Other  negative endocrine ROS  Renal/GU negative Renal ROS  negative genitourinary   Musculoskeletal  (+) Arthritis ,   Abdominal   Peds  Hematology negative hematology ROS (+)   Anesthesia Other Findings Past Medical History:   Allergy                                                      Arthritis                                                    Diverticulitis                                  2009         Family history of colon cancer                                 Comment:mother   Anxiety                                                      Hypertension                                                 Insomnia                                             Signs and symptoms suggestive of sleep apnea           Reproductive/Obstetrics negative OB ROS                             Anesthesia Physical Anesthesia Plan  ASA: III  Anesthesia Plan: General LMA   Post-op Pain Management:    Induction:   Airway Management Planned:   Additional Equipment:   Intra-op Plan:   Post-operative Plan:   Informed Consent: I have reviewed the patients History and Physical, chart,  labs and discussed the procedure including the  risks, benefits and alternatives for the proposed anesthesia with the patient or authorized representative who has indicated his/her understanding and acceptance.   Dental Advisory Given  Plan Discussed with: Anesthesiologist, CRNA and Surgeon  Anesthesia Plan Comments:         Anesthesia Quick Evaluation

## 2015-03-21 ENCOUNTER — Telehealth: Payer: Self-pay | Admitting: General Surgery

## 2015-03-21 LAB — SURGICAL PATHOLOGY

## 2015-03-21 NOTE — Telephone Encounter (Signed)
The patient reports that she is doing well after her biopsy.  No additional findings. No upstaging to DCIS/invasive cancer.  We'll discuss chemoprevention at her next visit.

## 2015-03-27 ENCOUNTER — Encounter: Payer: Self-pay | Admitting: General Surgery

## 2015-03-27 ENCOUNTER — Ambulatory Visit (INDEPENDENT_AMBULATORY_CARE_PROVIDER_SITE_OTHER): Payer: Medicare Other | Admitting: General Surgery

## 2015-03-27 VITALS — BP 140/76 | HR 74 | Resp 12 | Ht 62.0 in | Wt 174.0 lb

## 2015-03-27 DIAGNOSIS — N6099 Unspecified benign mammary dysplasia of unspecified breast: Secondary | ICD-10-CM

## 2015-03-27 DIAGNOSIS — N6091 Unspecified benign mammary dysplasia of right breast: Secondary | ICD-10-CM

## 2015-03-27 NOTE — Patient Instructions (Signed)
Patient to call us and let us about Tamoxifen . The patient has been asked to return to the office in one year with a bilateral diagnostic mammogram.

## 2015-03-27 NOTE — Progress Notes (Signed)
Patient ID: Michele Dean, female   DOB: 15-Jul-1945, 69 y.o.   MRN: [id number]  Chief Complaint  Patient presents with  . Follow-up    right breast biopsy    HPI Michele Dean is a 69 y.o. female here today for her post op right breast biopsy done on 03/20/15. Patient states she is doing well.  HPI  Past Medical History  Diagnosis Date  . Allergy   . Arthritis   . Diverticulitis 2009  . Family history of colon cancer     mother  . Anxiety   . Hypertension   . Insomnia     Past Surgical History  Procedure Laterality Date  . Colonoscopy  2009, 2014    DR Michele Dean  . Breast surgery Right 02-14-15    ATYPICAL DUCTAL HYPERPLASIA   . Tubal ligation    . Breast lumpectomy w/ needle localization Right 03/20/2015  . Breast biopsy Right 03/20/2015    Procedure: BREAST BIOPSY WITH NEEDLE LOCALIZATION;  Surgeon: Robert Bellow, MD;  Location: [facility] ORS;  Service: General;  Laterality: Right;    Family History  Problem Relation Age of Onset  . Cancer Mother 81    colon  . Breast cancer Maternal Aunt     ? age mid 69's    Social History Social History  Substance Use Topics  . Smoking status: Never Smoker   . Smokeless tobacco: Never Used  . Alcohol Use: Yes    Allergies  Allergen Reactions  . Adhesive [Tape] Itching  . Ceftin [Cefuroxime] Itching    Current Outpatient Prescriptions  Medication Sig Dispense Refill  . Calcium Glycerophosphate (PRELIEF PO) Take 2 tablets by mouth daily.    . cholecalciferol (VITAMIN D) 1000 UNITS tablet Take 1,000 Units by mouth daily.    . hydrochlorothiazide (HYDRODIURIL) 25 MG tablet Take 25 mg by mouth daily.   0  . HYDROcodone-acetaminophen (NORCO) 5-325 MG tablet Take 1-2 tablets by mouth every 4 (four) hours as needed. 30 tablet 0  . sertraline (ZOLOFT) 50 MG tablet Take 50 mg by mouth daily.      No current facility-administered medications for this visit.    Review of Systems Review of Systems  Constitutional: Negative.    Respiratory: Negative.   Cardiovascular: Negative.     Blood pressure 140/76, pulse 74, resp. rate 12, height 5\' 2"  (1.575 m), weight 174 lb (78.926 kg).  Physical Exam Physical Exam  Constitutional: She is oriented to person, place, and time. She appears well-developed and well-nourished.  Eyes: Conjunctivae are normal. No scleral icterus.  Neck: Neck supple.  Pulmonary/Chest:    Right breast biopsy site looks clean and healing well   Lymphadenopathy:    She has no cervical adenopathy.  Neurological: She is alert and oriented to person, place, and time.  Skin: Skin is warm and dry.    Data Reviewed Pathology showed ADH. No upstaging to DCIS/invasive carcinoma.  Assessment    Benign breast exam status post local excision of ADH.    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   Discussed Tamoxifen with patient.Patient to call us and let us know if she would like to try Tamoxifen  risks associated with tamoxifen include DVT, 1%; uterine cancer, 1%; vasomotor symptoms, significant. Anticipated risk reduction from 3.5% to 1.7% for malignancy over the next 5 years. At this time, the patient is leaning towards observation. She was encouraged to call if  she changed her mind.  The patient has been asked to return to the office in one year with a bilateral diagnostic mammogram.  PCP:  Almetta Lovely 03/27/2015, 11:23 AM

## 2015-04-02 ENCOUNTER — Ambulatory Visit: Payer: Medicare Other | Admitting: General Surgery

## 2015-11-15 ENCOUNTER — Other Ambulatory Visit: Payer: Self-pay

## 2015-11-15 DIAGNOSIS — N6091 Unspecified benign mammary dysplasia of right breast: Secondary | ICD-10-CM

## 2016-02-04 ENCOUNTER — Ambulatory Visit
Admission: RE | Admit: 2016-02-04 | Discharge: 2016-02-04 | Disposition: A | Discharge status: Home / Self Care | Payer: Medicare Other | Source: Ambulatory Visit | Attending: General Surgery | Admitting: General Surgery

## 2016-02-04 ENCOUNTER — Other Ambulatory Visit: Payer: Self-pay | Admitting: General Surgery

## 2016-02-04 DIAGNOSIS — Z1231 Encounter for screening mammogram for malignant neoplasm of breast: Secondary | ICD-10-CM | POA: Diagnosis present

## 2016-02-04 DIAGNOSIS — N6091 Unspecified benign mammary dysplasia of right breast: Secondary | ICD-10-CM | POA: Insufficient documentation

## 2016-02-13 ENCOUNTER — Encounter: Payer: Self-pay | Admitting: General Surgery

## 2016-02-13 ENCOUNTER — Ambulatory Visit (INDEPENDENT_AMBULATORY_CARE_PROVIDER_SITE_OTHER): Payer: Medicare Other | Admitting: General Surgery

## 2016-02-13 VITALS — BP 124/76 | HR 72 | Resp 12 | Ht 63.0 in | Wt 172.0 lb

## 2016-02-13 DIAGNOSIS — N6091 Unspecified benign mammary dysplasia of right breast: Secondary | ICD-10-CM

## 2016-02-13 DIAGNOSIS — N62 Hypertrophy of breast: Secondary | ICD-10-CM | POA: Diagnosis not present

## 2016-02-13 NOTE — Patient Instructions (Signed)
The patient is aware to call back for any questions or concerns.  

## 2016-02-13 NOTE — Progress Notes (Signed)
Patient ID: Michele Dean, female   DOB: 05-07-1946, 70 y.o.   MRN: [id number]  Chief Complaint  Patient presents with  . Follow-up    mammogram    HPI Michele Dean is a 70 y.o. female.  who presents for her follow up and a breast evaluation. The most recent mammogram was done on 02-04-16.  Patient does perform regular self breast checks and gets regular mammograms done.  No new breast issues.   HPI  Past Medical History:  Diagnosis Date  . Allergy   . Anxiety   . Arthritis   . Diverticulitis 2009  . Family history of colon cancer    mother  . Hypertension   . Insomnia     Past Surgical History:  Procedure Laterality Date  . BREAST BIOPSY Right 03/20/2015   Procedure: BREAST BIOPSY WITH NEEDLE LOCALIZATION;  Surgeon: Robert Bellow, MD;  Location: [facility] ORS;  Service: General;  Laterality: Right;  . BREAST LUMPECTOMY W/ NEEDLE LOCALIZATION Right 03/20/2015  . BREAST SURGERY Right 02-14-15   ATYPICAL DUCTAL HYPERPLASIA   . COLONOSCOPY  2009, 2014   DR Norell Brisbin  . TUBAL LIGATION      Family History  Problem Relation Age of Onset  . Cancer Mother 108    colon  . Breast cancer Maternal Aunt     ? age mid 71's    Social History Social History  Substance Use Topics  . Smoking status: Never Smoker  . Smokeless tobacco: Never Used  . Alcohol use Yes    Allergies  Allergen Reactions  . Adhesive [Tape] Itching  . Ceftin [Cefuroxime] Itching    Current Outpatient Prescriptions  Medication Sig Dispense Refill  . Calcium Glycerophosphate (PRELIEF PO) Take 2 tablets by mouth daily.    . cholecalciferol (VITAMIN D) 1000 UNITS tablet Take 1,000 Units by mouth daily.    . hydrochlorothiazide (HYDRODIURIL) 25 MG tablet Take 25 mg by mouth daily.   0  . sertraline (ZOLOFT) 50 MG tablet Take 50 mg by mouth daily.      No current facility-administered medications for this visit.     Review of Systems Review of Systems  Constitutional: Negative.   Respiratory:  Negative.   Cardiovascular: Negative.     Blood pressure 124/76, pulse 72, resp. rate 12, height 5\' 3"  (1.6 m), weight 172 lb (78 kg).  Physical Exam Physical Exam  Constitutional: She is oriented to person, place, and time. She appears well-developed and well-nourished.  HENT:  Mouth/Throat: Oropharynx is clear and moist.  Eyes: Conjunctivae are normal. No scleral icterus.  Neck: Neck supple.  Cardiovascular: Normal rate, regular rhythm and normal heart sounds.   Pulmonary/Chest: Effort normal and breath sounds normal. Right breast exhibits no inverted nipple, no mass, no nipple discharge, no skin change and no tenderness. Left breast exhibits no inverted nipple, no mass, no nipple discharge, no skin change and no tenderness.    Abdominal: Soft. There is no tenderness.  Lymphadenopathy:    She has no cervical adenopathy.    She has no axillary adenopathy.  Neurological: She is alert and oriented to person, place, and time.  Skin: Skin is warm and dry.  Psychiatric: Her behavior is normal.    Data Reviewed Bilateral diagnostic mammograms dated 02/04/2016 were reviewed. No interval change. BI-RADS-2.  Assessment    No evidence of recurrent ADH. Benign breast exam.    Plan    We reviewed her previous decision not make use of chemoprevention. Reasonable considering  only a less than 2% overall risk reduction.      The patient has been asked to return to the office in one year with a bilateral diagnostic mammogram.  This information has been scribed by Karie Fetch RN, BSN,BC.  Robert Bellow 02/13/2016, 12:20 PM

## 2016-12-04 ENCOUNTER — Other Ambulatory Visit: Payer: Self-pay

## 2016-12-04 DIAGNOSIS — N6091 Unspecified benign mammary dysplasia of right breast: Secondary | ICD-10-CM

## 2017-02-05 ENCOUNTER — Ambulatory Visit
Admission: RE | Admit: 2017-02-05 | Discharge: 2017-02-05 | Disposition: A | Discharge status: Home / Self Care | Payer: Medicare Other | Source: Ambulatory Visit | Attending: General Surgery | Admitting: General Surgery

## 2017-02-05 DIAGNOSIS — N6091 Unspecified benign mammary dysplasia of right breast: Secondary | ICD-10-CM

## 2017-02-12 ENCOUNTER — Encounter: Payer: Self-pay | Admitting: General Surgery

## 2017-02-12 ENCOUNTER — Ambulatory Visit (INDEPENDENT_AMBULATORY_CARE_PROVIDER_SITE_OTHER): Payer: Medicare Other | Admitting: General Surgery

## 2017-02-12 VITALS — BP 138/76 | HR 58 | Resp 12 | Ht 62.5 in | Wt 178.0 lb

## 2017-02-12 DIAGNOSIS — N6091 Unspecified benign mammary dysplasia of right breast: Secondary | ICD-10-CM

## 2017-02-12 DIAGNOSIS — Z800 Family history of malignant neoplasm of digestive organs: Secondary | ICD-10-CM

## 2017-02-12 DIAGNOSIS — Z860100 Personal history of colon polyps, unspecified: Secondary | ICD-10-CM

## 2017-02-12 NOTE — Patient Instructions (Addendum)
Patient will be asked to continue annual breast exams with Dr Hall Busing PCP, with a bilateral screening mammogram.  Colonoscopy due 2019

## 2017-02-12 NOTE — Progress Notes (Signed)
Patient ID: Michele Dean, female   DOB: 1946/04/19, 71 y.o.   MRN: [id number]  Chief Complaint  Patient presents with  . Follow-up    mammogram    HPI Michele Dean is a 71 y.o. female.  who presents for ADH right breast and a breast evaluation. The most recent mammogram was done on 02-05-17.  Patient does perform regular self breast checks and gets regular mammograms done. No new breast issues.    HPI  Past Medical History:  Diagnosis Date  . Allergy   . Anxiety   . Arthritis   . Colon polyp 2001  . Diverticulitis 2009  . Family history of colon cancer    mother  . Hypertension   . Insomnia     Past Surgical History:  Procedure Laterality Date  . BREAST BIOPSY Right 03/20/2015   Procedure: BREAST BIOPSY WITH NEEDLE LOCALIZATION;  Surgeon: Robert Bellow, MD;  Location: [facility] ORS;  Service: General;  Laterality: Right;  . BREAST EXCISIONAL BIOPSY Right 02/2015   excision of ADH  . BREAST LUMPECTOMY W/ NEEDLE LOCALIZATION Right 03/20/2015  . BREAST SURGERY Right 02/14/2015   ATYPICAL DUCTAL HYPERPLASIA , the patient declined chemoprevention.  . COLONOSCOPY  2009, 2014   DR Ragna Kramlich  . TUBAL LIGATION      Family History  Problem Relation Age of Onset  . Cancer Mother 85       colon  . Breast cancer Maternal Aunt        ? age mid 33's    Social History Social History  Substance Use Topics  . Smoking status: Never Smoker  . Smokeless tobacco: Never Used  . Alcohol use Yes    Allergies  Allergen Reactions  . Adhesive [Tape] Itching  . Ceftin [Cefuroxime] Itching    Current Outpatient Prescriptions  Medication Sig Dispense Refill  . Calcium Glycerophosphate (PRELIEF PO) Take 2 tablets by mouth daily.    . cholecalciferol (VITAMIN D) 1000 UNITS tablet Take 1,000 Units by mouth daily.    . hydrochlorothiazide (HYDRODIURIL) 25 MG tablet Take 25 mg by mouth daily.   0  . sertraline (ZOLOFT) 50 MG tablet Take 50 mg by mouth daily.      No current  facility-administered medications for this visit.     Review of Systems Review of Systems  Constitutional: Negative.   Respiratory: Negative.   Cardiovascular: Negative.     Blood pressure 138/76, pulse (!) 58, resp. rate 12, height 5' 2.5" (1.588 m), weight 178 lb (80.7 kg).  Physical Exam Physical Exam  Constitutional: She is oriented to person, place, and time. She appears well-developed and well-nourished.  HENT:  Mouth/Throat: Oropharynx is clear and moist.  Eyes: Conjunctivae are normal. No scleral icterus.  Neck: Neck supple.  Cardiovascular: Normal rate, regular rhythm and normal heart sounds.   Pulmonary/Chest: Effort normal and breath sounds normal. Right breast exhibits no inverted nipple, no mass, no nipple discharge, no skin change and no tenderness. Left breast exhibits no inverted nipple, no mass, no nipple discharge, no skin change and no tenderness.    Right breast incision clean and well healed  Lymphadenopathy:    She has no cervical adenopathy.    She has no axillary adenopathy.  Neurological: She is alert and oriented to person, place, and time.  Skin: Skin is warm and dry.  Psychiatric: Her behavior is normal.    Data Reviewed Bilateral diagnostic mammograms dated 02/05/2017 reviewed. No interval change. BI-RADS-1. Screening mammograms recommended in  the future.  Patient previously declined chemoprevention based on the biopsy findings of atypical ductal hyperplasia.  Assessment    Unremarkable breast exam. Previously documented ADH.  Candidate for follow-up colonoscopy in 2019.    Plan    As the patient requires no additional imaging and has declined chemoprevention, she is desirous of having her annual exams with her primary care physician.    Patient will be asked to resume annual breast exams with Dr Hall Busing PCP, with  bilateral screening mammograms.  The patient is aware to call back for any questions or new concerns.  Colonoscopy due  2019.  HPI, Physical Exam, Assessment and Plan have been scribed under the direction and in the presence of Robert Bellow, MD. Karie Fetch, RN  I have completed the exam and reviewed the above documentation for accuracy and completeness.  I agree with the above.  Haematologist has been used and any errors in dictation or transcription are unintentional.  Hervey Ard, M.D., F.A.C.S.  Robert Bellow 02/13/2017, 10:07 AM

## 2017-02-13 DIAGNOSIS — Z86010 Personal history of colonic polyps: Secondary | ICD-10-CM | POA: Insufficient documentation

## 2017-02-13 DIAGNOSIS — Z800 Family history of malignant neoplasm of digestive organs: Secondary | ICD-10-CM | POA: Insufficient documentation

## 2017-05-16 IMAGING — MG MM DIGITAL SCREENING BILAT W/ CAD
6 series · 6 of 6 positions shown · non-contrast
Comparison: Previous exam(s).

CLINICAL DATA: Screening.

EXAM:
DIGITAL SCREENING BILATERAL MAMMOGRAM WITH CAD

[L MLO]
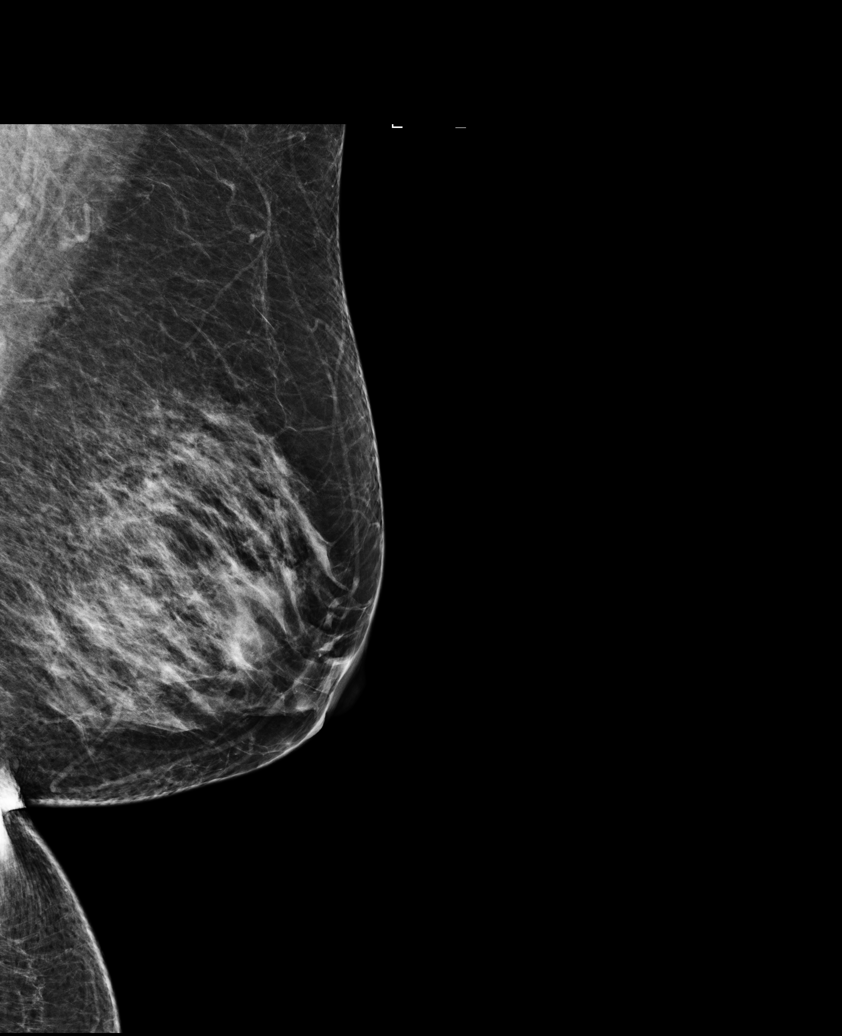

[R MLO (1 of 2)]
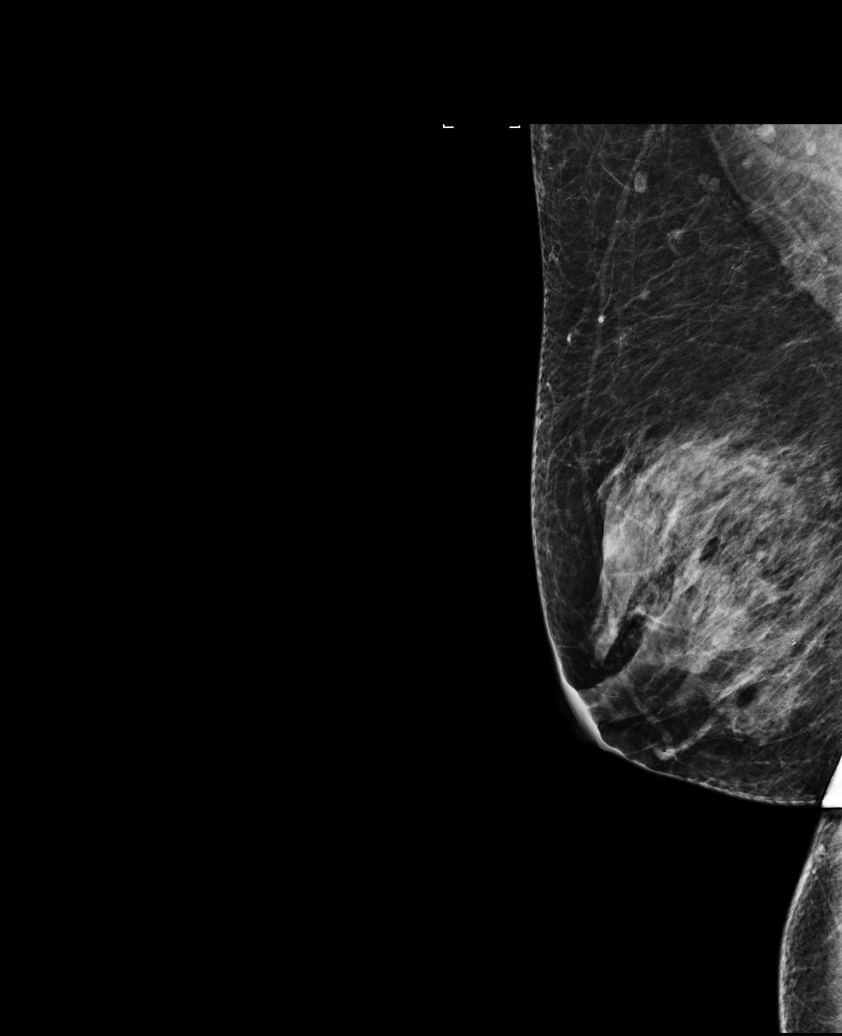

[L CC]
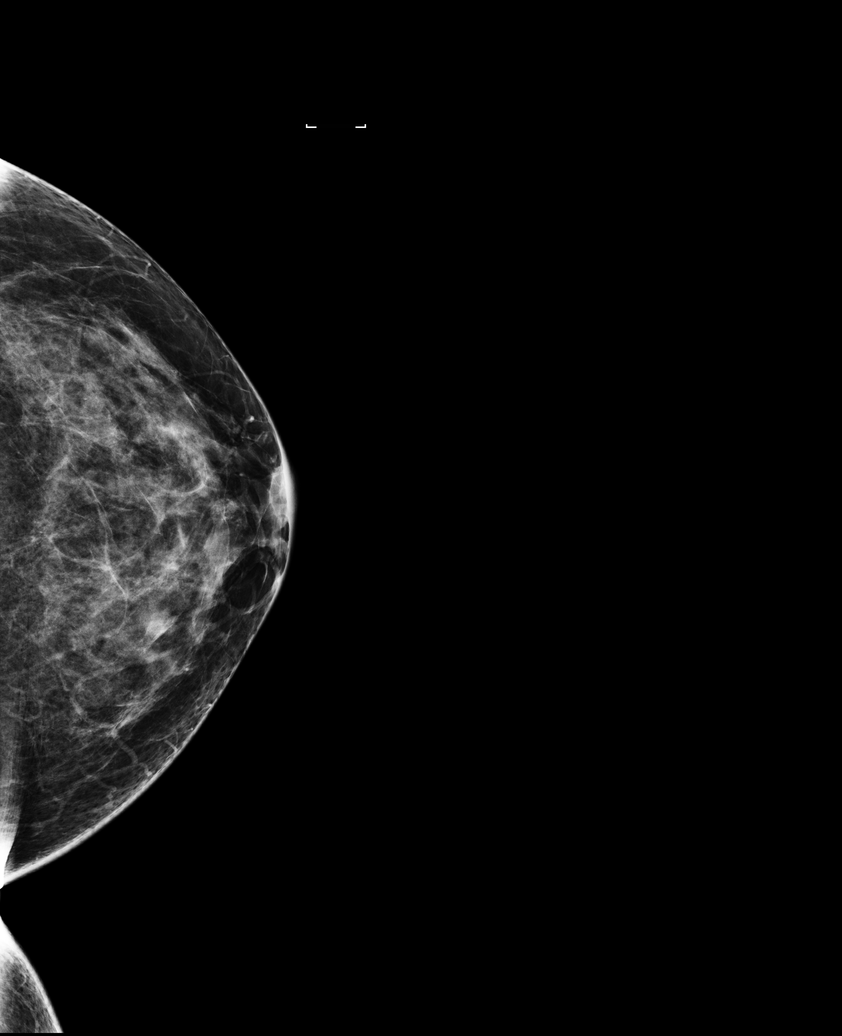

[R MLO (2 of 2)]
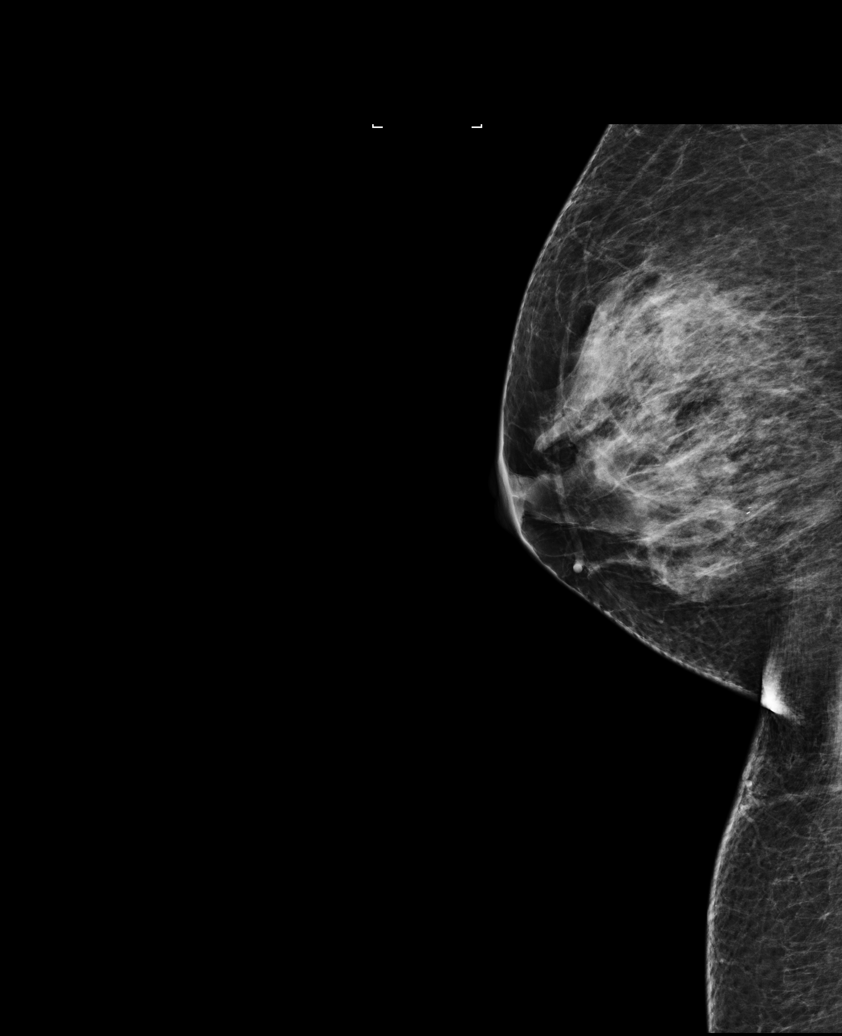

[R CC (1 of 2)]
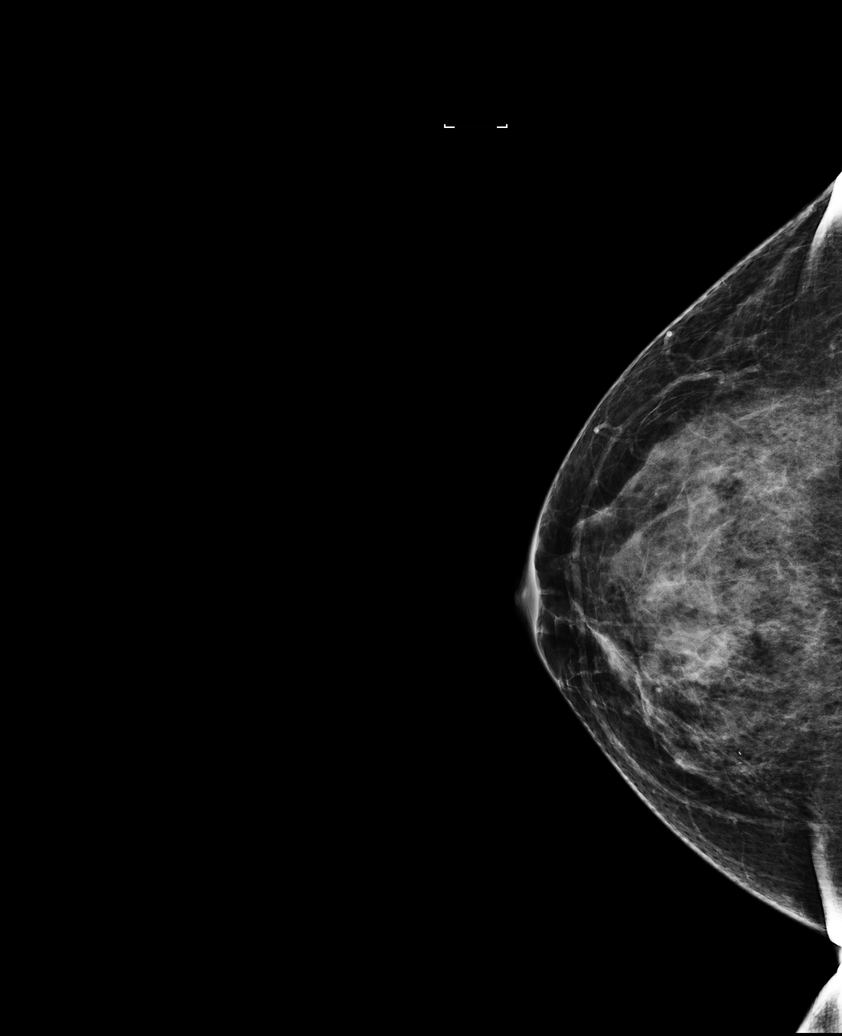

[R CC (2 of 2)]
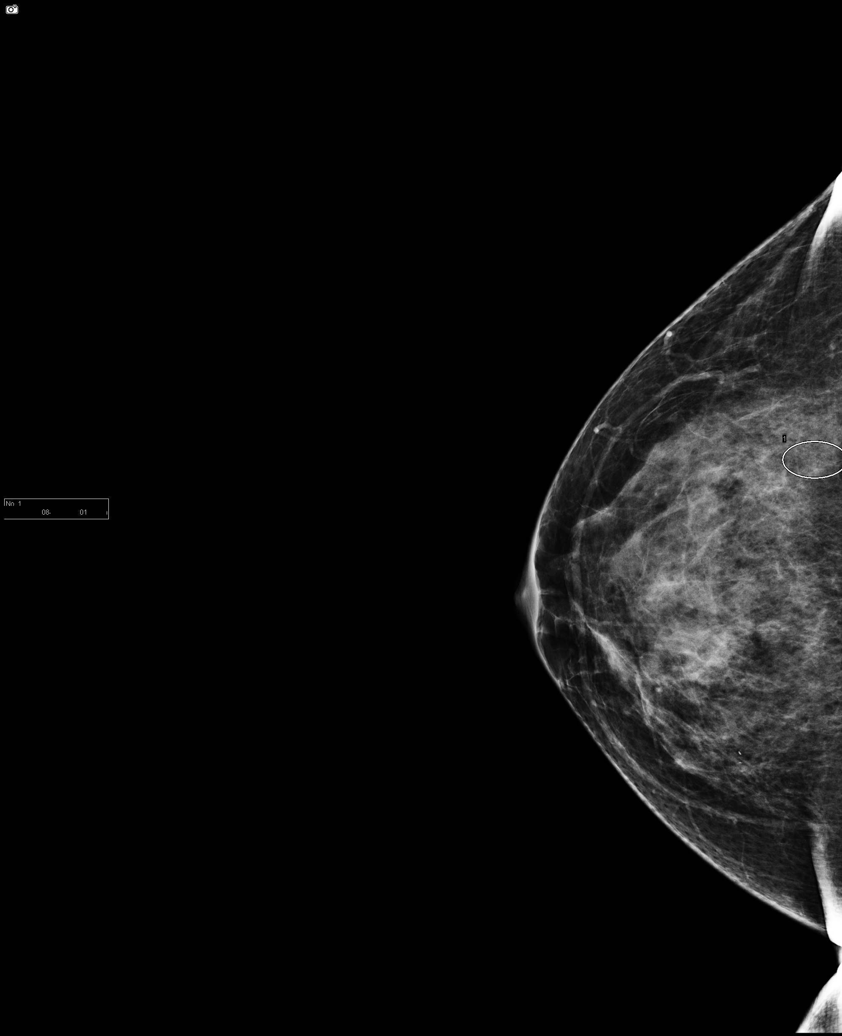

[6 of 6 positions shown; findings below may reference images not displayed]

ACR Breast Density Category c: The breast tissue is heterogeneously
dense, which may obscure small masses.
FINDINGS: In the right breast, calcifications warrant further evaluation with
magnified views. In the left breast, no findings suspicious for
malignancy. Images were processed with CAD.
IMPRESSION: Further evaluation is suggested for calcifications in the right
breast.

RECOMMENDATION:
Diagnostic mammogram of the right breast. (Code:[id number]-[id number])

The patient will be contacted regarding the findings, and additional
imaging will be scheduled.

BI-RADS CATEGORY  0: Incomplete. Need additional imaging evaluation
and/or prior mammograms for comparison.

## 2017-05-28 IMAGING — MG MM BREAST BX W/ LOC DEV 1ST LESION IMAGE BX SPEC STEREO GUIDE*R*
1 series · 6 of 7 positions shown · non-contrast
Comparison: Previous exams.

ADDENDUM:
PATHOLOGY ADDENDUM:

Pathology: Atypical ductal hyperplasia associated with
calcifications. "There is a single focus of atypical ductal
hyperplasia measuring up to 1.75 mm, and there are correlating
luminal calcifications within the ADH. This is a single small focus,
and there should be a careful evaluation of the patient's risk
factors and imaging findings to assess the need for excision of this
site. Note that the postprocedure mammogram shows about 2.5 cm of
superior clip migration from the expected location of the biopsied
calcifications."
Pathology concordant with imaging findings: Yes
Recommendation: Surgical consultation. As noted above, there was a
single small focus of atypical ductal hyperplasia. The post biopsy
mammogram demonstrated clip migration 2.5 cm superior to the
expected site of the biopsied calcifications. There are no definite
residual calcifications on the post biopsy mammogram.
At the request of the patient, I spoke with her by telephone on
02/19/2015 at [DATE]. She reports doing well after the biopsy except
for some mild skin irritation at the site of the Tegaderm. She
states that this area is improving..
The findings and recommendations for surgical consultation were
called to Zen[place], RN, in Dr. [REDACTED] who will arrange for
surgical referral.
CLINICAL DATA: Suspicious right breast calcifications.
EXAM:
RIGHT BREAST STEREOTACTIC CORE NEEDLE BIOPSY

[Series 1: R · right · 0.05mm/px · 6 of 7 slices shown]
[im 1/7]
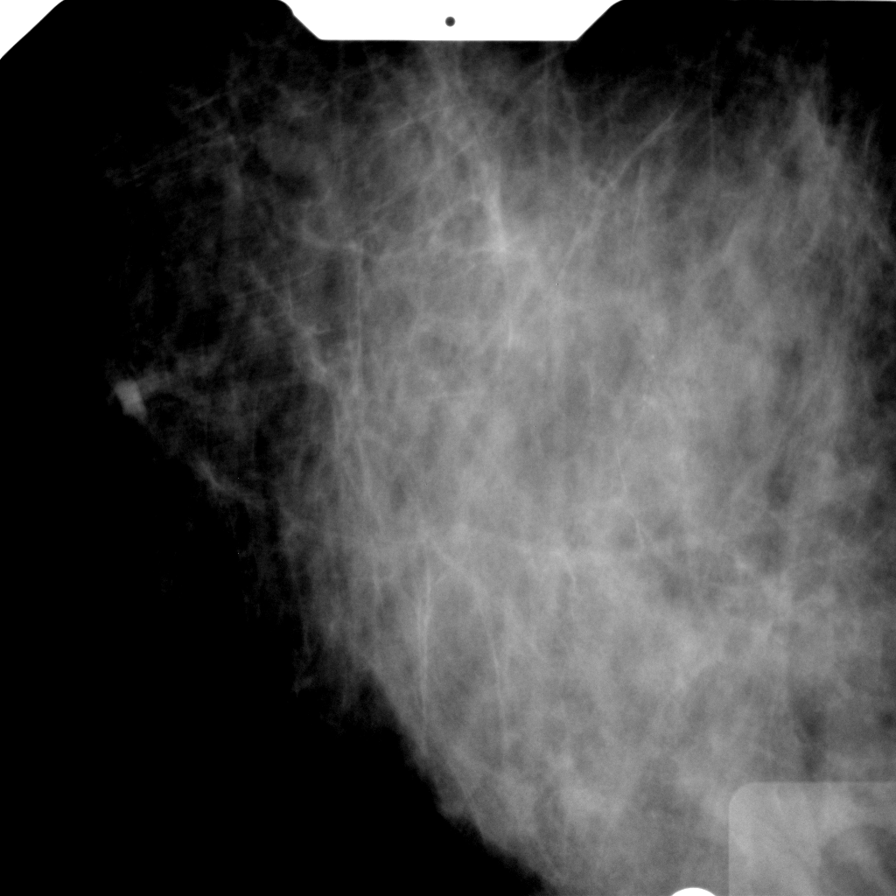
[im 2/7]
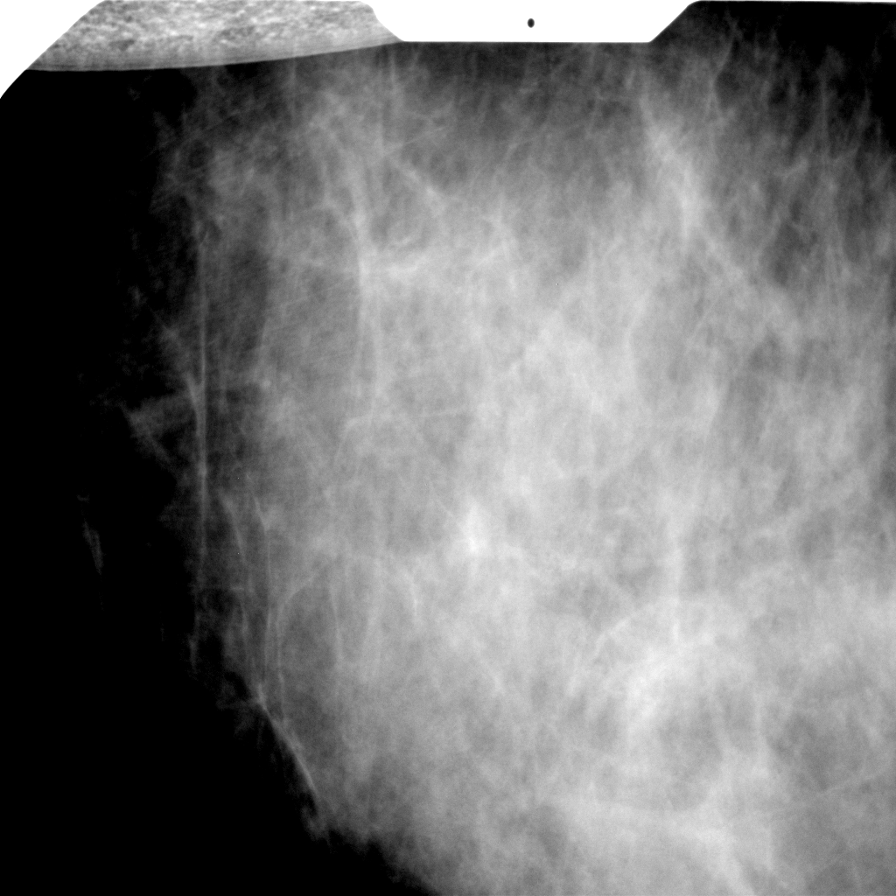
[im 3/7]
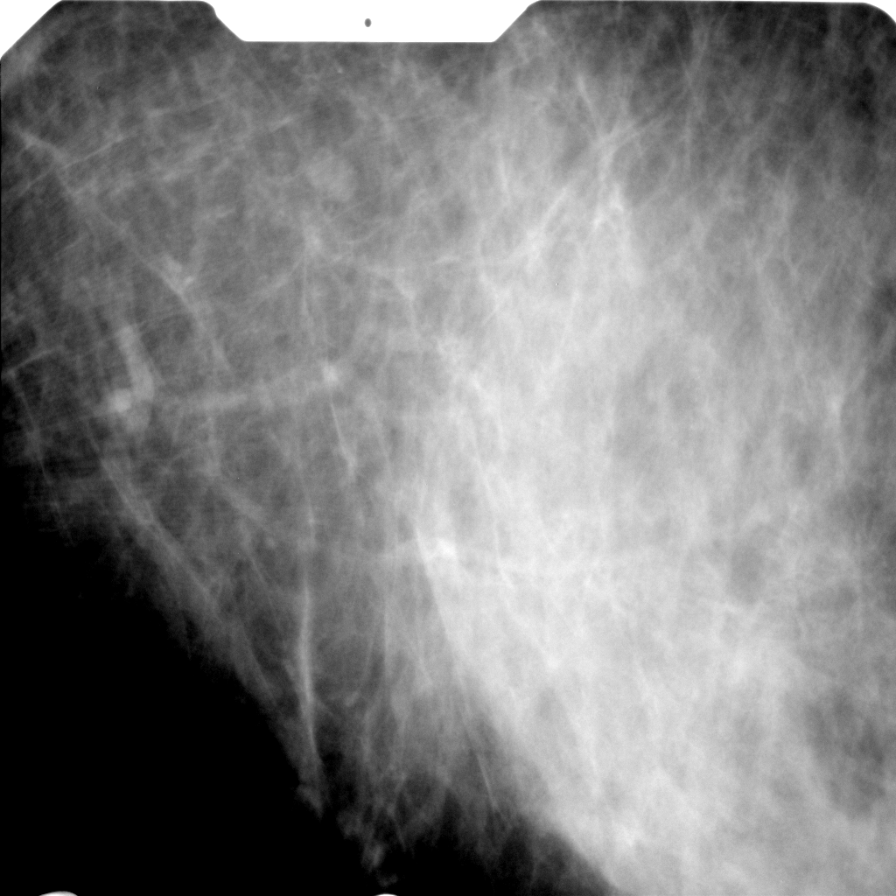
[im 4/7]
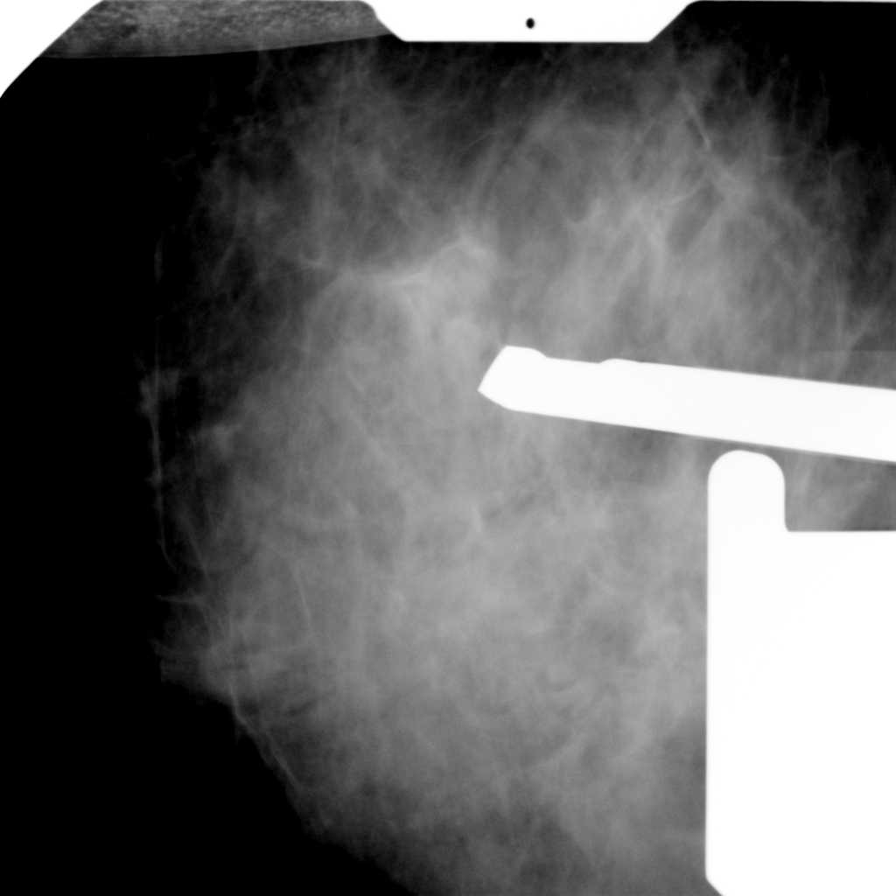
[im 5/7]
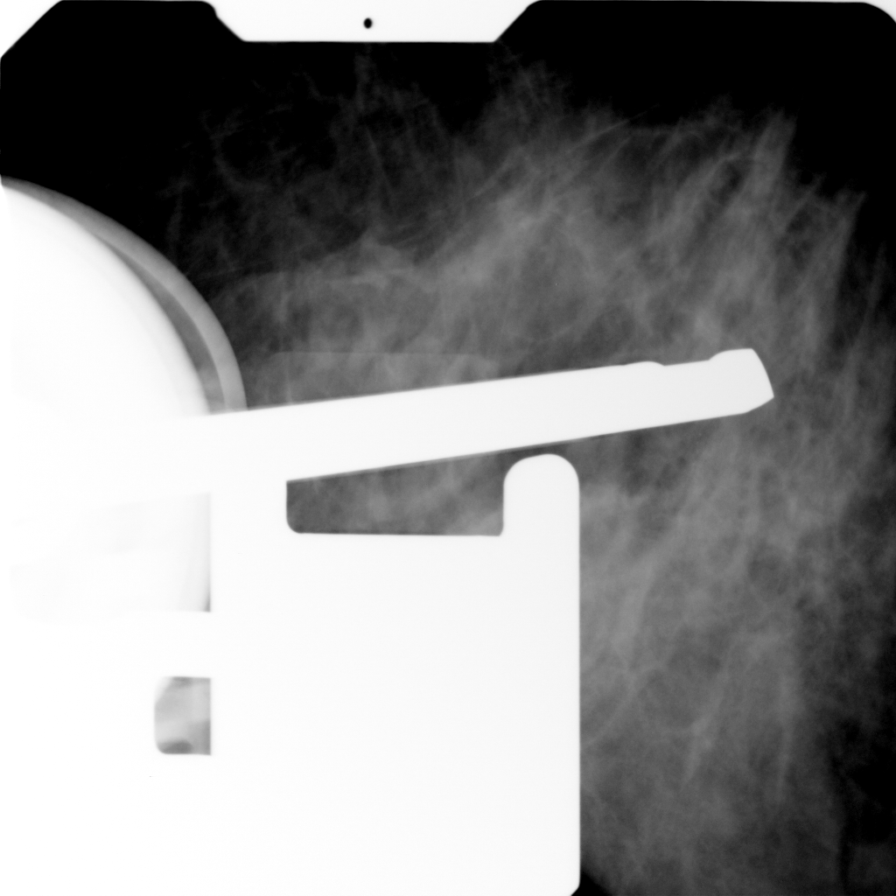
[im 6/7]
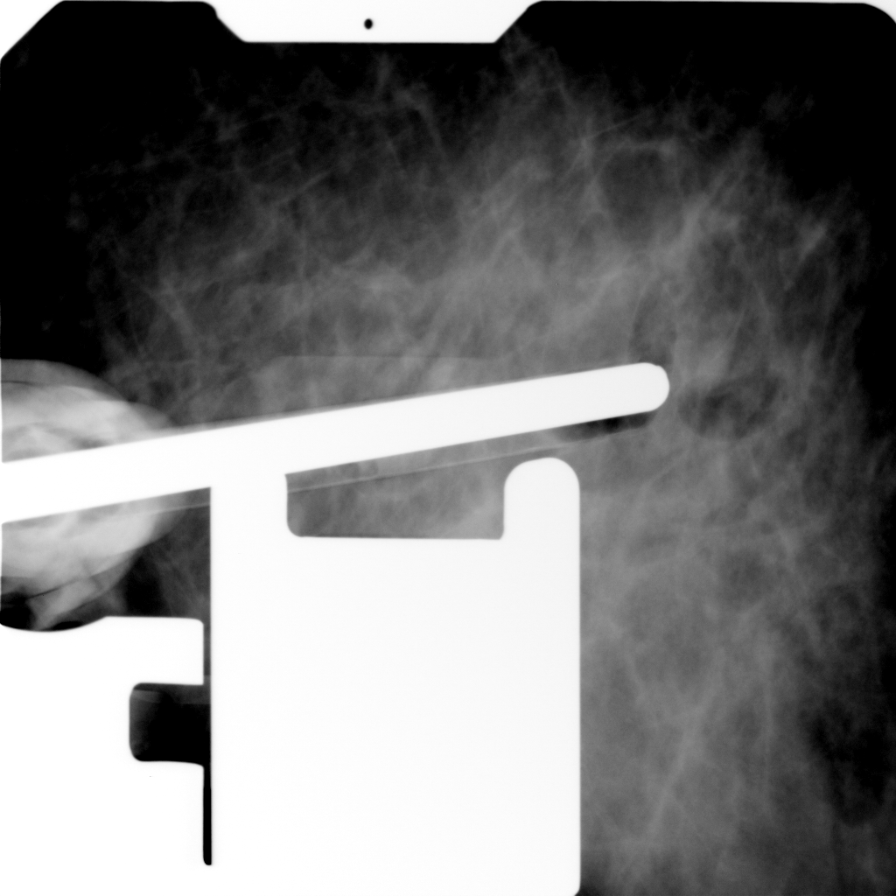

[6 of 7 positions shown; findings below may reference images not displayed]



Using sterile technique and 2% Lidocaine as local anesthetic, under
stereotactic guidance, a 9 gauge vacuum assisted core needle biopsy
device was used to perform core needle biopsy of calcifications
within the upper-outer right breast using a cranial approach.
Specimen radiograph was performed showing calcifications. Specimens
with calcifications are identified for pathology.

At the conclusion of the procedure, a cylinder-shaped tissue marker
clip was deployed into the biopsy cavity. Follow-up 2-view mammogram
was performed and dictated separately.
IMPRESSION: Stereotactic-guided biopsy of right breast calcifications, upper
outer quadrant. No apparent complications.

## 2017-05-28 IMAGING — MG MM DIGITAL DIAGNOSTIC UNILAT*R*
3 series · 3 of 3 positions shown · non-contrast
Comparison: Previous exam(s).

CLINICAL DATA: Patient status post stereotactic guided core needle
biopsy right breast calcifications.

EXAM:
DIAGNOSTIC RIGHT MAMMOGRAM POST STEREOTACTIC BIOPSY

[R ML (1 of 2)]
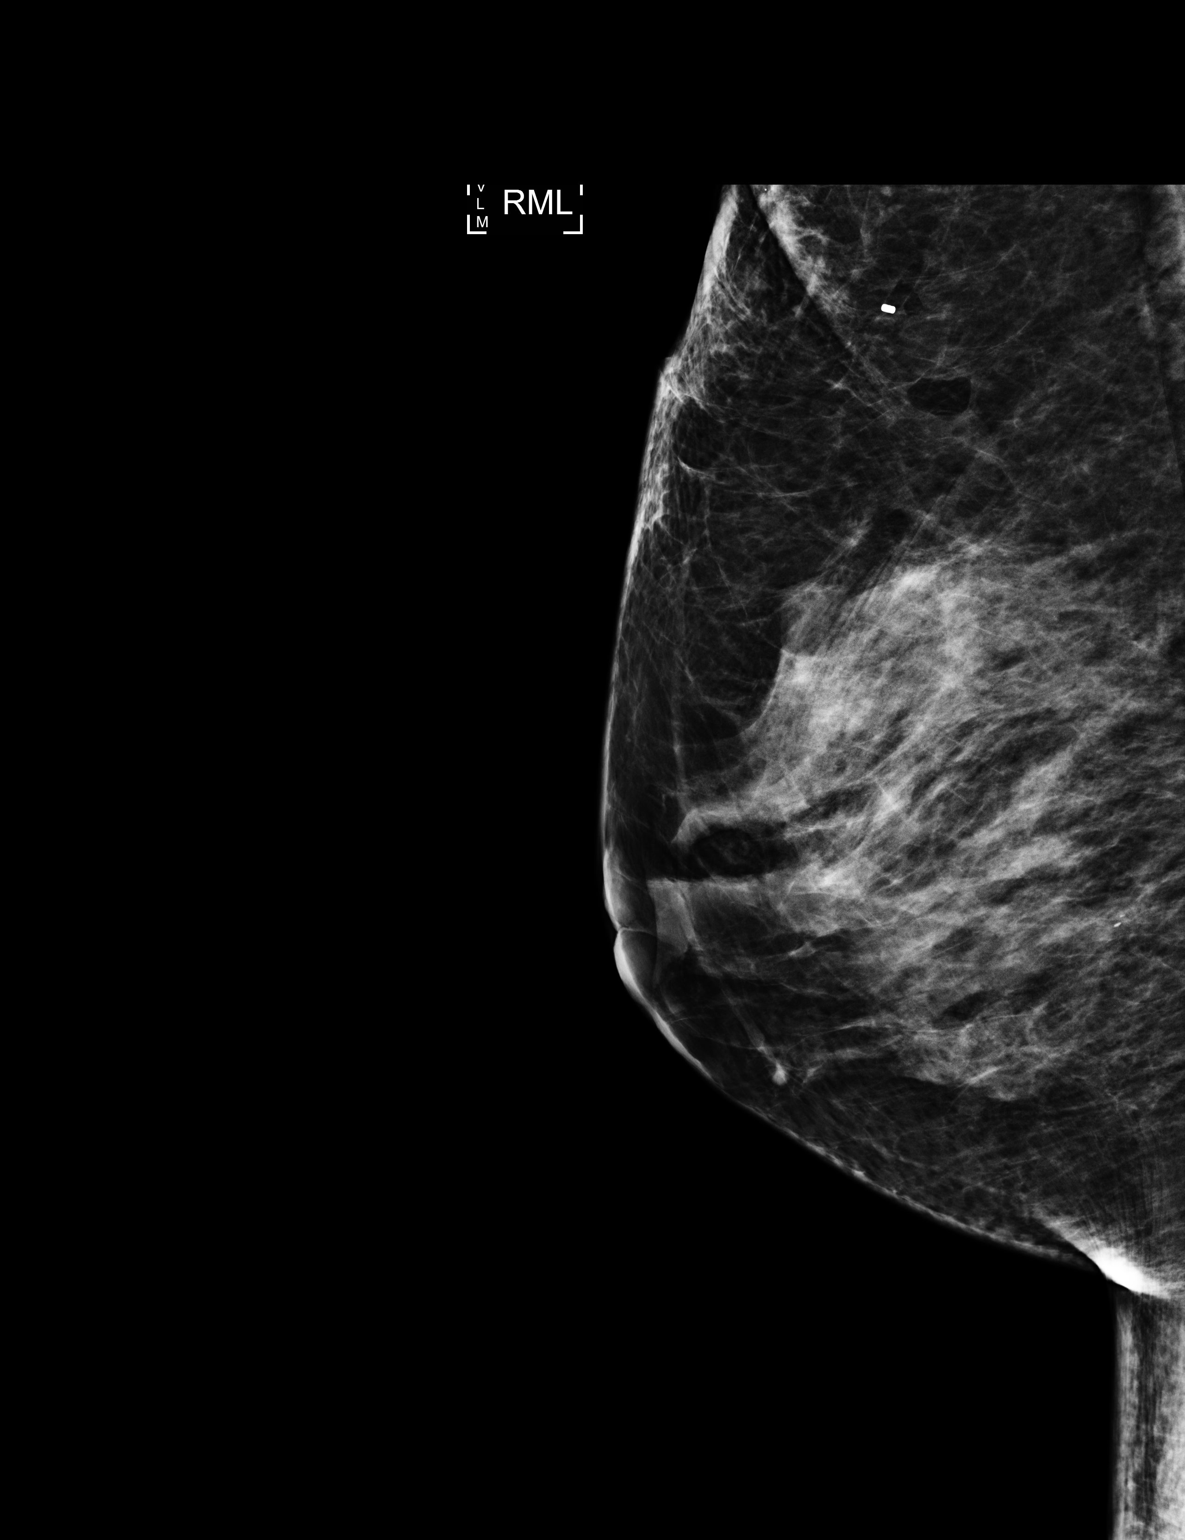

[R ML (2 of 2)]
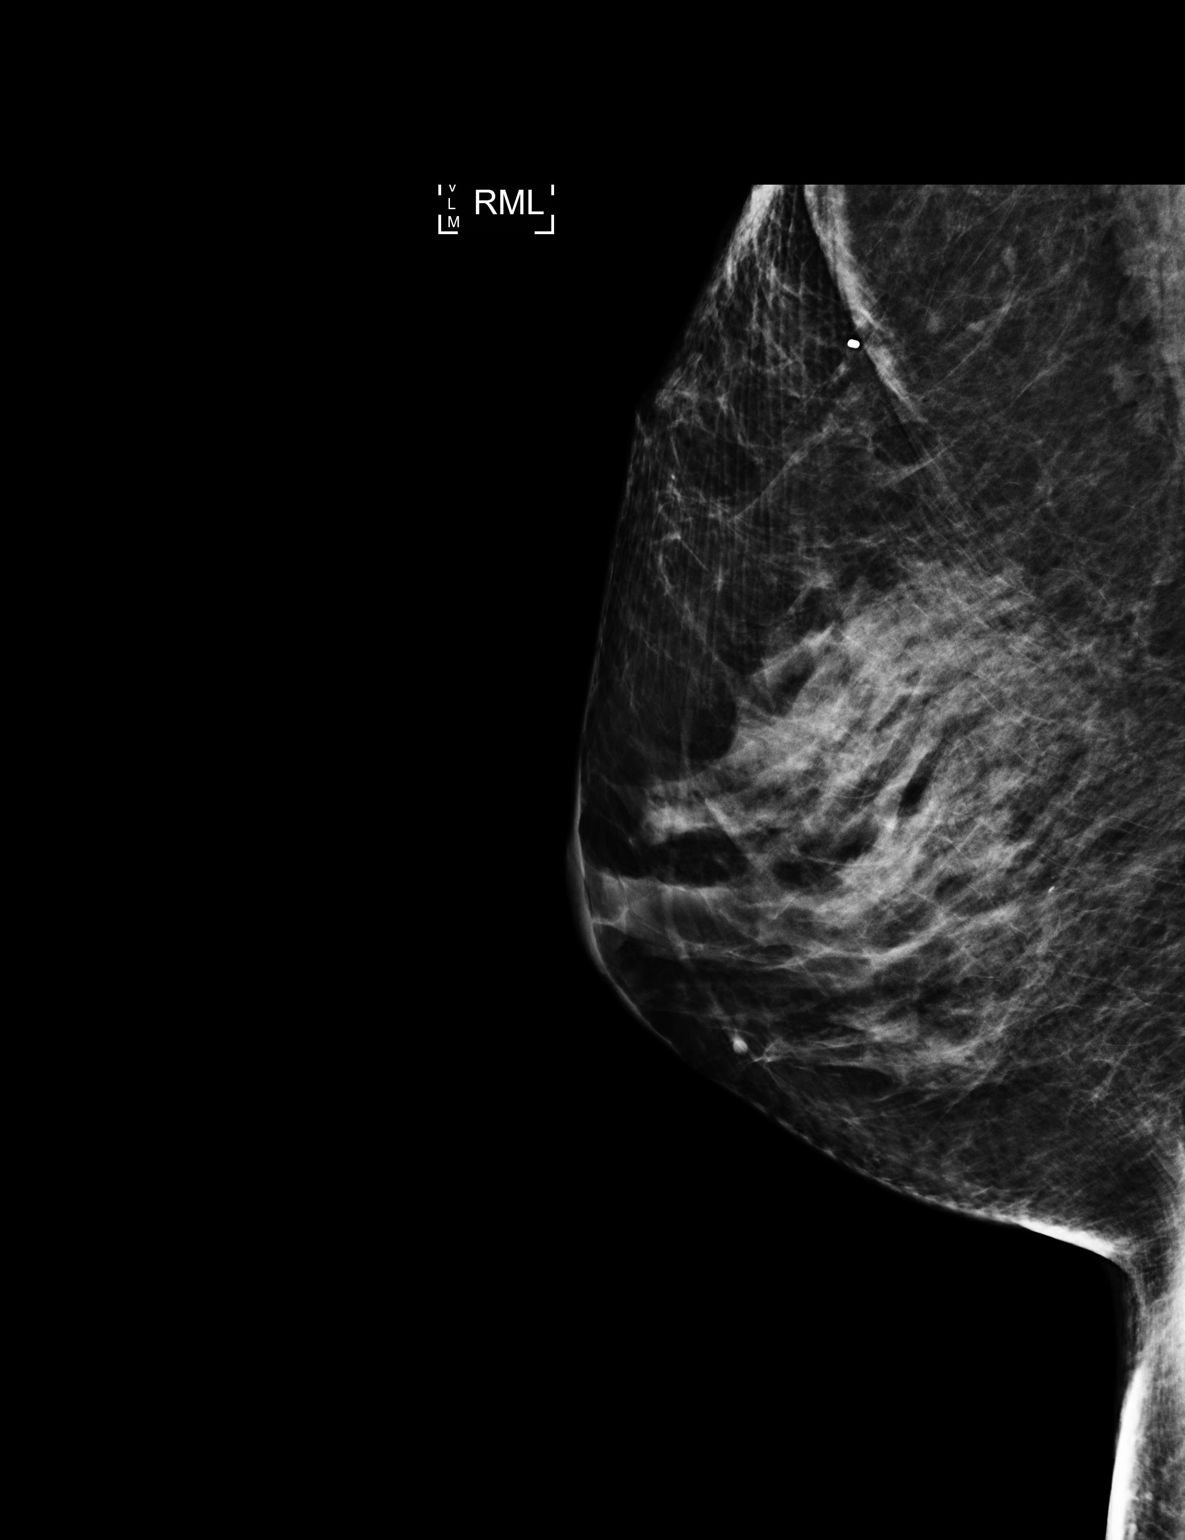

[R CC]
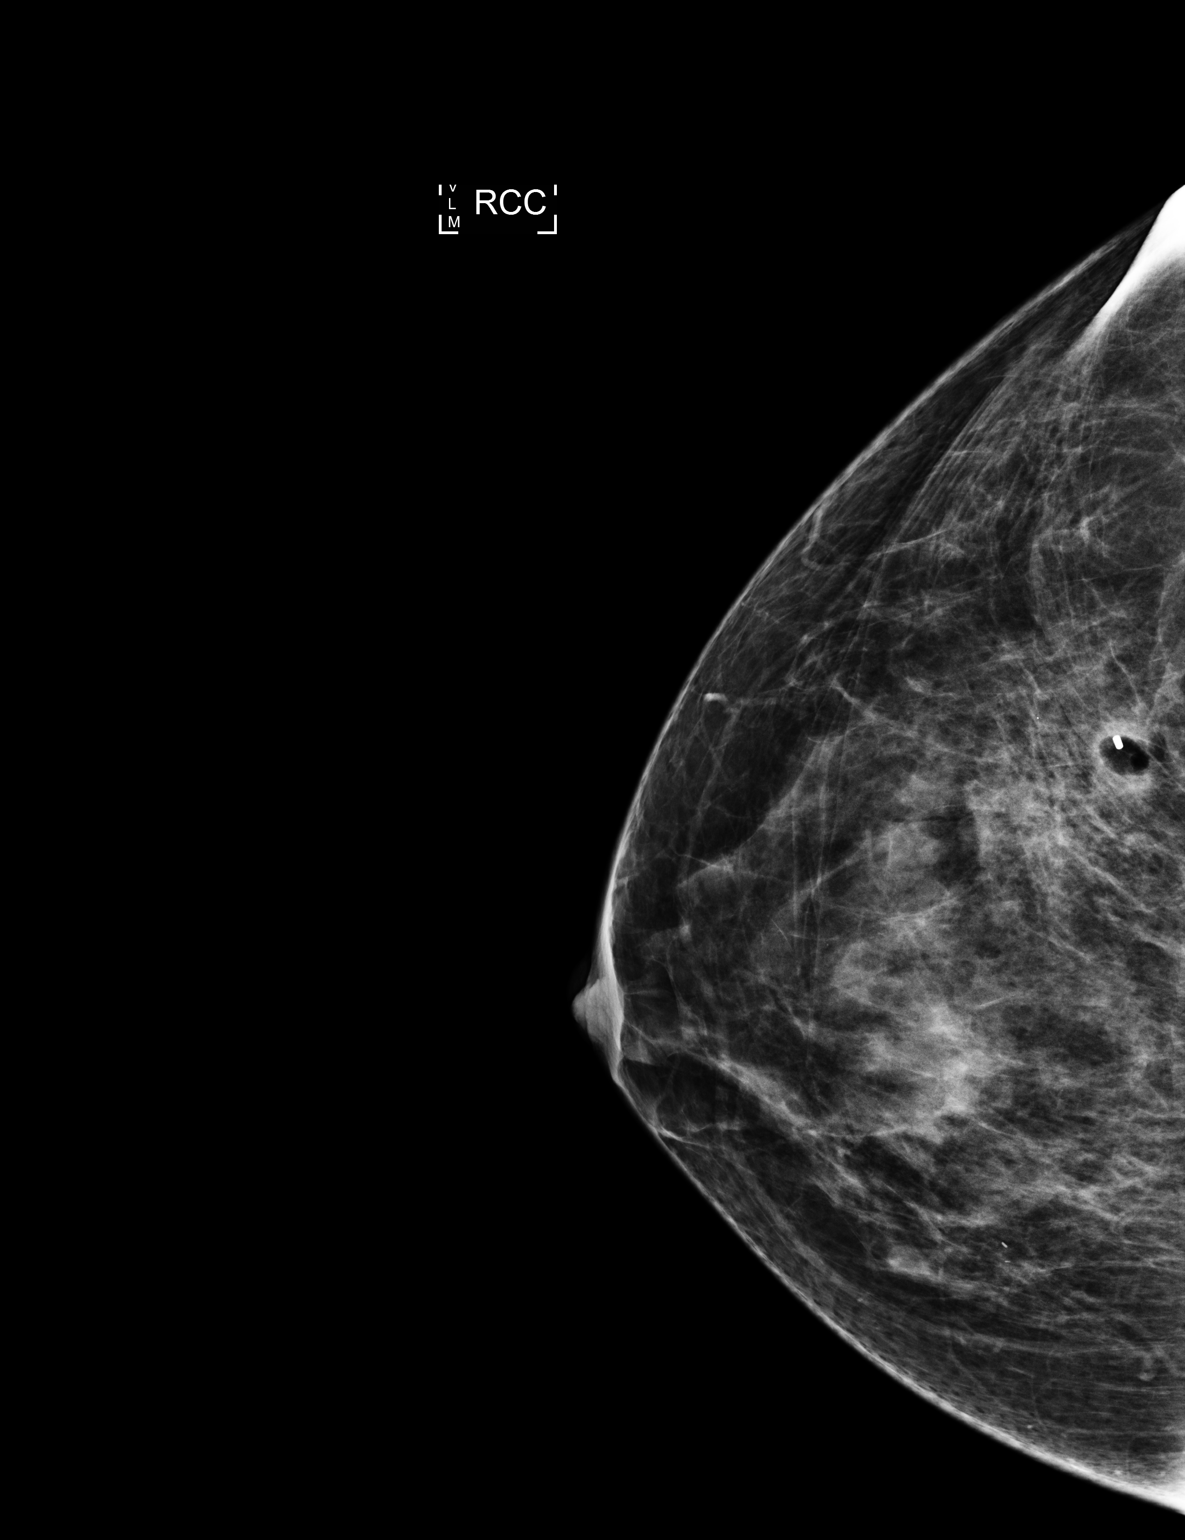

[3 of 3 positions shown; findings below may reference images not displayed]

FINDINGS: Mammographic images were obtained following stereotactic guided
biopsy of right breast calcifications. Cylinder-shaped marking clip
is present within the upper-outer right breast. There is apparent
superior clip migration from the expected location of the biopsied
calcifications, approximately 2.5 cm.
IMPRESSION: Approximate 2.5 cm of superior clip migration after stereotactic
guided core needle biopsy right breast calcifications.

Final Assessment: Post Procedure Mammograms for Marker Placement

## 2018-02-10 ENCOUNTER — Other Ambulatory Visit: Payer: Self-pay | Admitting: Internal Medicine

## 2018-02-10 ENCOUNTER — Encounter: Payer: Self-pay | Admitting: *Deleted

## 2018-02-10 DIAGNOSIS — Z1231 Encounter for screening mammogram for malignant neoplasm of breast: Secondary | ICD-10-CM

## 2018-02-25 ENCOUNTER — Ambulatory Visit
Admission: RE | Admit: 2018-02-25 | Discharge: 2018-02-25 | Disposition: A | Discharge status: Home / Self Care | Payer: Medicare Other | Source: Ambulatory Visit | Attending: Internal Medicine | Admitting: Internal Medicine

## 2018-02-25 DIAGNOSIS — Z1231 Encounter for screening mammogram for malignant neoplasm of breast: Secondary | ICD-10-CM | POA: Diagnosis not present

## 2018-03-23 ENCOUNTER — Encounter: Payer: Self-pay | Admitting: General Surgery

## 2018-03-23 ENCOUNTER — Ambulatory Visit: Payer: Medicare Other | Admitting: General Surgery

## 2018-03-23 VITALS — BP 134/80 | HR 84 | Resp 16 | Ht 62.0 in | Wt 187.0 lb

## 2018-03-23 DIAGNOSIS — Z800 Family history of malignant neoplasm of digestive organs: Secondary | ICD-10-CM

## 2018-03-23 MED ORDER — BISACODYL 5 MG PO TBEC: DELAYED_RELEASE_TABLET | ORAL | 0 refills | Status: DC

## 2018-03-23 MED ORDER — POLYETHYLENE GLYCOL 3350 17 GM/SCOOP PO POWD: 1.0000 | Freq: Once | ORAL | 0 refills | Status: AC

## 2018-03-23 NOTE — Progress Notes (Signed)
Patient ID: Michele Dean, female   DOB: 05-22-1946, 72 y.o.   MRN: [id number]  Chief Complaint  Patient presents with  . Colonoscopy    HPI [name] is a 72 y.o. female.  Who presents for a colonoscopy discussion. The last colonoscopy was completed on 02-22-13. Denies any gastrointestinal issues. Bowels move regular (mild constipation) and no bleeding noted.  HPI  Past Medical History:  Diagnosis Date  . Allergy   . Anxiety   . Arthritis   . Colon polyp 2001  . Diverticulitis 2009  . Family history of colon cancer    mother  . Hypertension   . Insomnia     Past Surgical History:  Procedure Laterality Date  . BREAST BIOPSY Right 03/20/2015   Procedure: BREAST BIOPSY WITH NEEDLE LOCALIZATION;  Surgeon: Robert Bellow, MD;  Location: [facility] ORS;  Service: General;  Laterality: Right;  . BREAST EXCISIONAL BIOPSY Right 02/2015   excision of ADH  . BREAST LUMPECTOMY W/ NEEDLE LOCALIZATION Right 03/20/2015  . BREAST SURGERY Right 02/14/2015   ATYPICAL DUCTAL HYPERPLASIA , the patient declined chemoprevention.  . COLONOSCOPY  2009, 2014   DR Imanuel Pruiett  . TUBAL LIGATION      Family History  Problem Relation Age of Onset  . Cancer Mother 62       colon  . Breast cancer Maternal Aunt        ? age mid 43's    Social History Social History   Tobacco Use  . Smoking status: Never Smoker  . Smokeless tobacco: Never Used  Substance Use Topics  . Alcohol use: Yes  . Drug use: No    Allergies  Allergen Reactions  . Adhesive [Tape] Itching  . Ceftin [Cefuroxime] Itching    Current Outpatient Medications  Medication Sig Dispense Refill  . cholecalciferol (VITAMIN D) 1000 UNITS tablet Take 1,000 Units by mouth daily.    . hydrochlorothiazide (HYDRODIURIL) 25 MG tablet Take 25 mg by mouth daily.   0  . meloxicam (MOBIC) 15 MG tablet as needed.   2  . psyllium (METAMUCIL) 58.6 % packet Take 1 packet by mouth daily.    . sertraline (ZOLOFT) 50 MG tablet Take 50 mg by  mouth daily.     . bisacodyl (DULCOLAX) 5 MG EC tablet Take 2 tablets in the morning and 2 tablets in the evening 2 days prior to your colonoscopy 4 tablet 0   No current facility-administered medications for this visit.     Review of Systems Review of Systems  Constitutional: Negative.   Respiratory: Negative.   Cardiovascular: Negative.   Gastrointestinal: Negative for constipation and diarrhea.    Blood pressure 134/80, pulse 84, resp. rate 16, height 5\' 2"  (1.575 m), weight 187 lb (84.8 kg), SpO2 99 %.  Physical Exam Physical Exam  Constitutional: She is oriented to person, place, and time. She appears well-developed and well-nourished.  HENT:  Mouth/Throat: Oropharynx is clear and moist.  Eyes: No scleral icterus.  Neck: Neck supple.  Cardiovascular: Normal rate, regular rhythm and normal heart sounds.  Pulmonary/Chest: Effort normal and breath sounds normal.  Neurological: She is alert and oriented to person, place, and time.  Skin: Skin is warm and dry.  Psychiatric: Her behavior is normal.    Data Reviewed February 22, 2013 colonoscopy was reviewed.  A single polyp was found in the descending colon.  Of note the patient's procedure was begun with her in the right lateral decubitus position to facilitate passage  of the rectosigmoid junction.  Pathology on the polyp showed to be hyperplastic.  Assessment    Candidate for follow-up colonoscopy based on family history.    Plan    Colonoscopy with possible biopsy/polypectomy prn: Information regarding the procedure, including its potential risks and complications (including but not limited to perforation of the bowel, which may require emergency surgery to repair, and bleeding) was verbally given to the patient. Educational information regarding lower intestinal endoscopy was given to the patient. Written instructions for how to complete the bowel prep using Miralax were provided. The importance of drinking ample fluids  to avoid dehydration as a result of the prep emphasized.     HPI, Physical Exam, Assessment and Plan have been scribed under the direction and in the presence of Robert Bellow, MD. Karie Fetch, RN  I have completed the exam and reviewed the above documentation for accuracy and completeness.  I agree with the above.  Haematologist has been used and any errors in dictation or transcription are unintentional.  Hervey Ard, M.D., F.A.C.S.  The patient is scheduled for a Colonoscopy at [facility] on 04/14/18. They are aware to call the day before to get their arrival time. She will take 10mg  dulcolax the morning and evening 2 days prior. Miralax prescription has been sent into the patient's pharmacy. The patient is aware of date and instructions.  Documented by Caryl-Lyn Otis Brace LPN   Forest Gleason Annaleah Arata 03/24/2018, 8:11 PM

## 2018-03-23 NOTE — Patient Instructions (Addendum)
The patient is aware to call back for any questions or concerns.  Colonoscopy, Adult A colonoscopy is an exam to look at the entire large intestine. During the exam, a lubricated, bendable tube is inserted into the anus and then passed into the rectum, colon, and other parts of the large intestine. A colonoscopy is often done as a part of normal colorectal screening or in response to certain symptoms, such as anemia, persistent diarrhea, abdominal pain, and blood in the stool. The exam can help screen for and diagnose medical problems, including:  Tumors.  Polyps.  Inflammation.  Areas of bleeding.  Tell a health care provider about:  Any allergies you have.  All medicines you are taking, including vitamins, herbs, eye drops, creams, and over-the-counter medicines.  Any problems you or family members have had with anesthetic medicines.  Any blood disorders you have.  Any surgeries you have had.  Any medical conditions you have.  Any problems you have had passing stool. What are the risks? Generally, this is a safe procedure. However, problems may occur, including:  Bleeding.  A tear in the intestine.  A reaction to medicines given during the exam.  Infection (rare).  What happens before the procedure? Eating and drinking restrictions Follow instructions from your health care provider about eating and drinking, which may include:  A few days before the procedure - follow a low-fiber diet. Avoid nuts, seeds, dried fruit, raw fruits, and vegetables.  1-3 days before the procedure - follow a clear liquid diet. Drink only clear liquids, such as clear broth or bouillon, black coffee or tea, clear juice, clear soft drinks or sports drinks, gelatin dessert, and popsicles. Avoid any liquids that contain red or purple dye.  On the day of the procedure - do not eat or drink anything during the 2 hours before the procedure, or within the time period that your health care provider  recommends.  Bowel prep If you were prescribed an oral bowel prep to clean out your colon:  Take it as told by your health care provider. Starting the day before your procedure, you will need to drink a large amount of medicated liquid. The liquid will cause you to have multiple loose stools until your stool is almost clear or light green.  If your skin or anus gets irritated from diarrhea, you may use these to relieve the irritation: ? Medicated wipes, such as adult wet wipes with aloe and vitamin E. ? A skin soothing-product like petroleum jelly.  If you vomit while drinking the bowel prep, take a break for up to 60 minutes and then begin the bowel prep again. If vomiting continues and you cannot take the bowel prep without vomiting, call your health care provider.  General instructions  Ask your health care provider about changing or stopping your regular medicines. This is especially important if you are taking diabetes medicines or blood thinners.  Plan to have someone take you home from the hospital or clinic. What happens during the procedure?  An IV tube may be inserted into one of your veins.  You will be given medicine to help you relax (sedative).  To reduce your risk of infection: ? Your health care team will wash or sanitize their hands. ? Your anal area will be washed with soap.  You will be asked to lie on your side with your knees bent.  Your health care provider will lubricate a long, thin, flexible tube. The tube will have a camera and   a light on the end.  The tube will be inserted into your anus.  The tube will be gently eased through your rectum and colon.  Air will be delivered into your colon to keep it open. You may feel some pressure or cramping.  The camera will be used to take images during the procedure.  A small tissue sample may be removed from your body to be examined under a microscope (biopsy). If any potential problems are found, the tissue  will be sent to a lab for testing.  If small polyps are found, your health care provider may remove them and have them checked for cancer cells.  The tube that was inserted into your anus will be slowly removed. The procedure may vary among health care providers and hospitals. What happens after the procedure?  Your blood pressure, heart rate, breathing rate, and blood oxygen level will be monitored until the medicines you were given have worn off.  Do not drive for 24 hours after the exam.  You may have a small amount of blood in your stool.  You may pass gas and have mild abdominal cramping or bloating due to the air that was used to inflate your colon during the exam.  It is up to you to get the results of your procedure. Ask your health care provider, or the department performing the procedure, when your results will be ready. This information is not intended to replace advice given to you by your health care provider. Make sure you discuss any questions you have with your health care provider. Document Released: 06/06/2000 Document Revised: 04/09/2016 Document Reviewed: 08/21/2015 Elsevier Interactive Patient Education  [employer].   The patient is scheduled for a Colonoscopy at [facility] on 04/14/18. They are aware to call the day before to get their arrival time. She will take 10mg  dulcolax the morning and evening 2 days prior. Miralax prescription has been sent into the patient's pharmacy. The patient is aware of date and instructions.

## 2018-04-14 ENCOUNTER — Ambulatory Visit: Payer: Medicare Other | Admitting: Anesthesiology

## 2018-04-14 ENCOUNTER — Ambulatory Visit
Admission: RE | Admit: 2018-04-14 | Discharge: 2018-04-14 | Disposition: A | Discharge status: Home / Self Care | Payer: Medicare Other | Source: Ambulatory Visit | Attending: General Surgery | Admitting: General Surgery

## 2018-04-14 ENCOUNTER — Encounter
Admission: RE | Disposition: A | Discharge status: Home / Self Care | Payer: Self-pay | Source: Ambulatory Visit | Attending: General Surgery

## 2018-04-14 DIAGNOSIS — Z79899 Other long term (current) drug therapy: Secondary | ICD-10-CM | POA: Insufficient documentation

## 2018-04-14 DIAGNOSIS — M1990 Unspecified osteoarthritis, unspecified site: Secondary | ICD-10-CM | POA: Insufficient documentation

## 2018-04-14 DIAGNOSIS — I10 Essential (primary) hypertension: Secondary | ICD-10-CM | POA: Diagnosis not present

## 2018-04-14 DIAGNOSIS — Z881 Allergy status to other antibiotic agents status: Secondary | ICD-10-CM | POA: Insufficient documentation

## 2018-04-14 DIAGNOSIS — E669 Obesity, unspecified: Secondary | ICD-10-CM | POA: Insufficient documentation

## 2018-04-14 DIAGNOSIS — Z888 Allergy status to other drugs, medicaments and biological substances status: Secondary | ICD-10-CM | POA: Insufficient documentation

## 2018-04-14 DIAGNOSIS — Z1211 Encounter for screening for malignant neoplasm of colon: Secondary | ICD-10-CM | POA: Diagnosis present

## 2018-04-14 DIAGNOSIS — F419 Anxiety disorder, unspecified: Secondary | ICD-10-CM | POA: Diagnosis not present

## 2018-04-14 DIAGNOSIS — Z800 Family history of malignant neoplasm of digestive organs: Secondary | ICD-10-CM | POA: Insufficient documentation

## 2018-04-14 DIAGNOSIS — Z86010 Personal history of colonic polyps: Secondary | ICD-10-CM | POA: Diagnosis not present

## 2018-04-14 DIAGNOSIS — G4700 Insomnia, unspecified: Secondary | ICD-10-CM | POA: Diagnosis not present

## 2018-04-14 DIAGNOSIS — K5130 Ulcerative (chronic) rectosigmoiditis without complications: Secondary | ICD-10-CM | POA: Diagnosis not present

## 2018-04-14 DIAGNOSIS — Z6832 Body mass index (BMI) 32.0-32.9, adult: Secondary | ICD-10-CM | POA: Insufficient documentation

## 2018-04-14 DIAGNOSIS — K5730 Diverticulosis of large intestine without perforation or abscess without bleeding: Secondary | ICD-10-CM | POA: Diagnosis not present

## 2018-04-14 HISTORY — PX: COLONOSCOPY WITH PROPOFOL: SHX5780

## 2018-04-14 SURGERY — COLONOSCOPY WITH PROPOFOL
Anesthesia: General

## 2018-04-14 MED ORDER — SODIUM CHLORIDE 0.9 % IV SOLN
INTRAVENOUS | Status: DC
  Administered 2018-04-14: 1000 mL via INTRAVENOUS

## 2018-04-14 MED ORDER — FENTANYL CITRATE (PF) 100 MCG/2ML IJ SOLN
INTRAMUSCULAR | Status: AC
  Filled 2018-04-14: qty 2

## 2018-04-14 MED ORDER — PROPOFOL 500 MG/50ML IV EMUL
INTRAVENOUS | Status: DC | PRN
  Administered 2018-04-14: 170 ug/kg/min via INTRAVENOUS

## 2018-04-14 MED ORDER — LIDOCAINE 2% (20 MG/ML) 5 ML SYRINGE
INTRAMUSCULAR | Status: DC | PRN
  Administered 2018-04-14: 30 mg via INTRAVENOUS

## 2018-04-14 MED ORDER — PROPOFOL 500 MG/50ML IV EMUL
INTRAVENOUS | Status: AC
  Filled 2018-04-14: qty 50

## 2018-04-14 MED ORDER — LIDOCAINE HCL (PF) 2 % IJ SOLN
INTRAMUSCULAR | Status: AC
  Filled 2018-04-14: qty 10

## 2018-04-14 MED ORDER — PROPOFOL 10 MG/ML IV BOLUS
INTRAVENOUS | Status: DC | PRN
  Administered 2018-04-14: 100 mg via INTRAVENOUS

## 2018-04-14 MED ORDER — FENTANYL CITRATE (PF) 100 MCG/2ML IJ SOLN
INTRAMUSCULAR | Status: DC | PRN
  Administered 2018-04-14 (×2): 50 ug via INTRAVENOUS

## 2018-04-14 MED ORDER — PHENYLEPHRINE HCL 10 MG/ML IJ SOLN
INTRAMUSCULAR | Status: DC | PRN
  Administered 2018-04-14: 100 ug via INTRAVENOUS

## 2018-04-14 NOTE — Anesthesia Post-op Follow-up Note (Signed)
Anesthesia QCDR form completed.        

## 2018-04-14 NOTE — Op Note (Signed)
[facility] Gastroenterology Patient Name: Michele Dean Procedure Date: 04/14/2018 9:37 AM MRN: [id number] Account #: [id number] Date of Birth: 1945-09-18 Admit Type: Outpatient Age: 72 Room: [facility] ENDO ROOM 1 Gender: Female Note Status: Finalized Procedure:            Colonoscopy Indications:          High risk colon cancer surveillance: Personal history                        of colonic polyps Providers:            Robert Bellow, MD Referring MD:         Leona Carry. Hall Busing, MD (Referring MD) Medicines:            Monitored Anesthesia Care Complications:        No immediate complications. Procedure:            Pre-Anesthesia Assessment:                       - Prior to the procedure, a History and Physical was                        performed, and patient medications, allergies and                        sensitivities were reviewed. The patient's tolerance of                        previous anesthesia was reviewed.                       - The risks and benefits of the procedure and the                        sedation options and risks were discussed with the                        patient. All questions were answered and informed                        consent was obtained.                       After obtaining informed consent, the colonoscope was                        passed under direct vision. Throughout the procedure,                        the patient's blood pressure, pulse, and oxygen                        saturations were monitored continuously. The                        Colonoscope was introduced through the anus and                        advanced to the the cecum, identified by appendiceal  orifice and ileocecal valve. The colonoscopy was                        somewhat difficult due to restricted mobility of the                        sigmoid colon. The procedure was begun in the right                        lateral  decubitus position, turning the patient to the                        left lateral decubitus position after negotiating the                        sigmoid descending junction as in 2014. Successful                        completion of the procedure was aided by using manual                        pressure. The patient tolerated the procedure well. The                        quality of the bowel preparation was excellent. Findings:      A few small and large-mouthed diverticula were found in the sigmoid       colon.      The retroflexed view of the distal rectum and anal verge was normal and       showed no anal or rectal abnormalities. Impression:           - Diverticulosis in the sigmoid colon.                       - The distal rectum and anal verge are normal on                        retroflexion view.                       - No specimens collected. Recommendation:       - Repeat colonoscopy in 5 years for surveillance. Procedure Code(s):    --- Professional ---                       [phone], Colonoscopy, flexible; diagnostic, including                        collection of specimen(s) by brushing or washing, when                        performed (separate procedure) Diagnosis Code(s):    --- Professional ---                       Z86.010, Personal history of colonic polyps                       K57.30, Diverticulosis of large intestine without  perforation or abscess without bleeding CPT copyright 2018 American Medical Association. All rights reserved. The codes documented in this report are preliminary and upon coder review may  be revised to meet current compliance requirements. Robert Bellow, MD 04/14/2018 10:17:06 AM This report has been signed electronically. Number of Addenda: 0 Note Initiated On: 04/14/2018 9:37 AM Scope Withdrawal Time: 0 hours 8 minutes 45 seconds  Total Procedure Duration: 0 hours 26 minutes 59 seconds       [facility]

## 2018-04-14 NOTE — Anesthesia Postprocedure Evaluation (Signed)
Anesthesia Post Note  Patient: Michele Dean  Procedure(s) Performed: COLONOSCOPY WITH PROPOFOL (N/A )  Patient location during evaluation: Endoscopy Anesthesia Type: General Level of consciousness: awake and alert and oriented Pain management: pain level controlled Vital Signs Assessment: post-procedure vital signs reviewed and stable Respiratory status: spontaneous breathing, nonlabored ventilation and respiratory function stable Cardiovascular status: blood pressure returned to baseline and stable Postop Assessment: no signs of nausea or vomiting Anesthetic complications: no     Last Vitals:  Vitals:   04/14/18 1027 04/14/18 1037  BP: 121/63 135/77  Pulse: 82 73  Resp: 15 11  Temp:    SpO2: 100% 100%    Last Pain:  Vitals:   04/14/18 1037  TempSrc:   PainSc: 0-No pain                 Geraldin Habermehl

## 2018-04-14 NOTE — Anesthesia Preprocedure Evaluation (Signed)
Anesthesia Evaluation  Patient identified by MRN, date of birth, ID band Patient awake    Reviewed: Allergy & Precautions, NPO status , Patient's Chart, lab work & pertinent test results  History of Anesthesia Complications Negative for: history of anesthetic complications  Airway Mallampati: II  TM Distance: >3 FB Neck ROM: Full    Dental no notable dental hx.    Pulmonary neg pulmonary ROS, neg sleep apnea, neg COPD,    breath sounds clear to auscultation- rhonchi (-) wheezing      Cardiovascular hypertension, Pt. on medications (-) CAD, (-) Past MI, (-) Cardiac Stents and (-) CABG  Rhythm:Regular Rate:Normal - Systolic murmurs and - Diastolic murmurs    Neuro/Psych neg Seizures Anxiety negative neurological ROS     GI/Hepatic negative GI ROS, Neg liver ROS,   Endo/Other  negative endocrine ROSneg diabetes  Renal/GU negative Renal ROS     Musculoskeletal  (+) Arthritis ,   Abdominal (+) + obese,   Peds  Hematology negative hematology ROS (+)   Anesthesia Other Findings Past Medical History: No date: Allergy No date: Anxiety No date: Arthritis 2001: Colon polyp 2009: Diverticulitis No date: Family history of colon cancer     Comment:  mother No date: Hypertension No date: Insomnia   Reproductive/Obstetrics                             Anesthesia Physical Anesthesia Plan  ASA: II  Anesthesia Plan: General   Post-op Pain Management:    Induction: Intravenous  PONV Risk Score and Plan: 2 and Propofol infusion  Airway Management Planned: Natural Airway  Additional Equipment:   Intra-op Plan:   Post-operative Plan:   Informed Consent: I have reviewed the patients History and Physical, chart, labs and discussed the procedure including the risks, benefits and alternatives for the proposed anesthesia with the patient or authorized representative who has indicated his/her  understanding and acceptance.   Dental advisory given  Plan Discussed with: CRNA and Anesthesiologist  Anesthesia Plan Comments:         Anesthesia Quick Evaluation

## 2018-04-14 NOTE — Transfer of Care (Signed)
Immediate Anesthesia Transfer of Care Note  Patient: Michele Dean  Procedure(s) Performed: COLONOSCOPY WITH PROPOFOL (N/A )  Patient Location: PACU and Endoscopy Unit  Anesthesia Type:General  Level of Consciousness: awake and patient cooperative  Airway & Oxygen Therapy: Patient Spontanous Breathing and Patient connected to nasal cannula oxygen  Post-op Assessment: Report given to RN and Post -op Vital signs reviewed and stable  Post vital signs: Reviewed and stable  Last Vitals:  Vitals Value Taken Time  BP    Temp    Pulse 84 04/14/2018 10:16 AM  Resp 12 04/14/2018 10:16 AM  SpO2 96 % 04/14/2018 10:16 AM  Vitals shown include unvalidated device data.  Last Pain:  Vitals:   04/14/18 0905  TempSrc: Tympanic  PainSc: 0-No pain         Complications: No apparent anesthesia complications

## 2018-04-14 NOTE — H&P (Signed)
Michele Dean [id number] [birth year]     HPI:  Family history of colon cancer.  Last exam in 2014 notable for a hyperplastic polyp.  Tolerated prep well.   Medications Prior to Admission  Medication Sig Dispense Refill Last Dose  . bisacodyl (DULCOLAX) 5 MG EC tablet Take 2 tablets in the morning and 2 tablets in the evening 2 days prior to your colonoscopy 4 tablet 0 Past Week at Unknown time  . cholecalciferol (VITAMIN D) 1000 UNITS tablet Take 1,000 Units by mouth daily.   Past Week at Unknown time  . hydrochlorothiazide (HYDRODIURIL) 25 MG tablet Take 25 mg by mouth daily.   0 04/13/2018 at Unknown time  . meloxicam (MOBIC) 15 MG tablet as needed.   2 Past Week at Unknown time  . psyllium (METAMUCIL) 58.6 % packet Take 1 packet by mouth daily.   Past Week at Unknown time  . sertraline (ZOLOFT) 50 MG tablet Take 50 mg by mouth daily.    04/13/2018 at Unknown time   Allergies  Allergen Reactions  . Adhesive [Tape] Itching  . Ceftin [Cefuroxime] Itching   Past Medical History:  Diagnosis Date  . Allergy   . Anxiety   . Arthritis   . Colon polyp 2001  . Diverticulitis 2009  . Family history of colon cancer    mother  . Hypertension   . Insomnia    Past Surgical History:  Procedure Laterality Date  . BREAST BIOPSY Right 03/20/2015   Procedure: BREAST BIOPSY WITH NEEDLE LOCALIZATION;  Surgeon: Robert Bellow, MD;  Location: [facility] ORS;  Service: General;  Laterality: Right;  . BREAST EXCISIONAL BIOPSY Right 02/2015   excision of ADH  . BREAST LUMPECTOMY W/ NEEDLE LOCALIZATION Right 03/20/2015  . BREAST SURGERY Right 02/14/2015   ATYPICAL DUCTAL HYPERPLASIA , the patient declined chemoprevention.  . COLONOSCOPY  2009, 2014   DR Michele Dean  . TUBAL LIGATION     Social History   Socioeconomic History  . Marital status: Married    Spouse name: Not on file  . Number of children: Not on file  . Years of education: Not on file  . Highest education level: Not on file   Occupational History  . Not on file  Social Needs  . Financial resource strain: Not on file  . Food insecurity:    Worry: Not on file    Inability: Not on file  . Transportation needs:    Medical: Not on file    Non-medical: Not on file  Tobacco Use  . Smoking status: Never Smoker  . Smokeless tobacco: Never Used  Substance and Sexual Activity  . Alcohol use: Yes  . Drug use: Never  . Sexual activity: Not on file  Lifestyle  . Physical activity:    Days per week: Not on file    Minutes per session: Not on file  . Stress: Not on file  Relationships  . Social connections:    Talks on phone: Not on file    Gets together: Not on file    Attends religious service: Not on file    Active member of club or organization: Not on file    Attends meetings of clubs or organizations: Not on file    Relationship status: Not on file  . Intimate partner violence:    Fear of current or ex partner: Not on file    Emotionally abused: Not on file    Physically abused: Not on file    Forced  sexual activity: Not on file  Other Topics Concern  . Not on file  Social History Narrative  . Not on file   Social History   Social History Narrative  . Not on file     ROS: Negative.     PE: HEENT: Negative. Lungs: Clear. Cardio: RR.  Assessment/Plan:  Proceed with planned endoscopy.    Michele Dean Michele Dean 04/14/2018

## 2018-10-27 ENCOUNTER — Other Ambulatory Visit: Payer: Self-pay

## 2018-10-27 ENCOUNTER — Ambulatory Visit
Admission: RE | Admit: 2018-10-27 | Discharge: 2018-10-27 | Disposition: A | Discharge status: Home / Self Care | Payer: Medicare Other | Source: Ambulatory Visit | Attending: Internal Medicine | Admitting: Internal Medicine

## 2018-10-27 DIAGNOSIS — I10 Essential (primary) hypertension: Secondary | ICD-10-CM | POA: Diagnosis present

## 2018-11-10 ENCOUNTER — Encounter: Admission: RE | Payer: Self-pay | Source: Home / Self Care

## 2018-11-10 ENCOUNTER — Inpatient Hospital Stay: Admission: RE | Admit: 2018-11-10 | Payer: Medicare Other | Source: Home / Self Care | Admitting: Orthopedic Surgery

## 2018-11-10 SURGERY — ARTHROPLASTY, HIP, TOTAL,POSTERIOR APPROACH
Anesthesia: General | Laterality: Left

## 2018-12-28 ENCOUNTER — Ambulatory Visit: Payer: Self-pay | Admitting: Orthopedic Surgery

## 2019-01-07 ENCOUNTER — Encounter
Admission: RE | Admit: 2019-01-07 | Discharge: 2019-01-07 | Disposition: A | Discharge status: Home / Self Care | Payer: Medicare Other | Source: Ambulatory Visit | Attending: Orthopedic Surgery | Admitting: Orthopedic Surgery

## 2019-01-07 ENCOUNTER — Other Ambulatory Visit: Payer: Self-pay

## 2019-01-07 DIAGNOSIS — Z01812 Encounter for preprocedural laboratory examination: Secondary | ICD-10-CM | POA: Diagnosis not present

## 2019-01-07 DIAGNOSIS — Z1159 Encounter for screening for other viral diseases: Secondary | ICD-10-CM | POA: Insufficient documentation

## 2019-01-07 LAB — CBC
HCT: 41.5 % (ref 36.0–46.0)
Hemoglobin: 13.9 g/dL (ref 12.0–15.0)
MCH: 31.7 pg (ref 26.0–34.0)
MCHC: 33.5 g/dL (ref 30.0–36.0)
MCV: 94.7 fL (ref 80.0–100.0)
Platelets: 282 10*3/uL (ref 150–400)
RBC: 4.38 MIL/uL (ref 3.87–5.11)
RDW: 12.1 % (ref 11.5–15.5)
WBC: 10.6 10*3/uL — ABNORMAL HIGH (ref 4.0–10.5)
nRBC: 0 % (ref 0.0–0.2)

## 2019-01-07 LAB — BASIC METABOLIC PANEL
Anion gap: 12 (ref 5–15)
BUN: 12 mg/dL (ref 8–23)
CO2: 25 mmol/L (ref 22–32)
Calcium: 9.5 mg/dL (ref 8.9–10.3)
Chloride: 95 mmol/L — ABNORMAL LOW (ref 98–111)
Creatinine, Ser: 0.43 mg/dL — ABNORMAL LOW (ref 0.44–1.00)
GFR calc Af Amer: >60 mL/min (ref >60)
GFR calc non Af Amer: >60 mL/min (ref >60)
Glucose, Bld: 105 mg/dL — ABNORMAL HIGH (ref 70–99)
Potassium: 3.5 mmol/L (ref 3.5–5.1)
Sodium: 132 mmol/L — ABNORMAL LOW (ref 135–145)

## 2019-01-07 LAB — PROTIME-INR
INR: 0.9 (ref 0.8–1.2)
Prothrombin Time: 12.4 seconds (ref 11.4–15.2)

## 2019-01-07 LAB — TYPE AND SCREEN
ABO/RH(D): A POS
Antibody Screen: NEGATIVE

## 2019-01-07 LAB — URINALYSIS, ROUTINE W REFLEX MICROSCOPIC
Bilirubin Urine: NEGATIVE
Glucose, UA: NEGATIVE mg/dL
Hgb urine dipstick: NEGATIVE
Ketones, ur: NEGATIVE mg/dL
Leukocytes,Ua: NEGATIVE
Nitrite: NEGATIVE
Protein, ur: NEGATIVE mg/dL
Specific Gravity, Urine: 1.006 (ref 1.005–1.030)
pH: 7 (ref 5.0–8.0)

## 2019-01-07 LAB — SURGICAL PCR SCREEN
MRSA, PCR: NEGATIVE
Staphylococcus aureus: NEGATIVE

## 2019-01-07 LAB — SARS CORONAVIRUS 2 (TAT 6-24 HRS): SARS Coronavirus 2: NEGATIVE

## 2019-01-07 LAB — APTT: aPTT: 25 seconds (ref 24–36)

## 2019-01-07 NOTE — Patient Instructions (Addendum)
Your procedure is scheduled on: Wed 01/12/19 Report to [facility]. To find out your arrival time please call [phone] between 1PM - 3PM on Tue 01/11/19.  Remember: Instructions that are not followed completely may result in serious medical risk, up to and including death, or upon the discretion of your surgeon and anesthesiologist your surgery may need to be rescheduled.     _X__ 1. Do not eat food after midnight the night before your procedure.                 No gum chewing or hard candies. You may drink clear liquids up to 2 hours                 before you are scheduled to arrive for your surgery- DO not drink clear                 liquids within 2 hours of the start of your surgery.                 Clear Liquids include:  water, apple juice without pulp, clear carbohydrate                 drink such as Clearfast or Gatorade, Black Coffee or Tea (Do not add                 anything to coffee or tea). Diabetics water only  __X__2.  On the morning of surgery brush your teeth with toothpaste and water, you                 may rinse your mouth with mouthwash if you wish.  Do not swallow any              toothpaste of mouthwash.     _X__ 3.  No Alcohol for 24 hours before or after surgery.   _X__ 4.  Do Not Smoke or use e-cigarettes For 24 Hours Prior to Your Surgery.                 Do not use any chewable tobacco products for at least 6 hours prior to                 surgery.  ____  5.  Bring all medications with you on the day of surgery if instructed.   __X__  6.  Notify your doctor if there is any change in your medical condition      (cold, fever, infections).     Do not wear jewelry, make-up, hairpins, clips or nail polish. Do not wear lotions, powders, or perfumes.  Do not shave 48 hours prior to surgery. Men may shave face and neck. Do not bring valuables to the hospital.    [facility] is not responsible for any  belongings or valuables.  Contacts, dentures/partials or body piercings may not be worn into surgery. Bring a case for your contacts, glasses or hearing aids, a denture cup will be supplied. Leave your suitcase in the car. After surgery it may be brought to your room. For patients admitted to the hospital, discharge time is determined by your treatment team.   Patients discharged the day of surgery will not be allowed to drive home.   Please read over the following fact sheets that you were given:   MRSA Information  __X__ Take these medicines the morning of surgery with A SIP OF WATER:  1. sertraline (ZOLOFT  2.   3.   4.  5.  6.  ____ Fleet Enema (as directed)   __X__ Use CHG Soap/SAGE wipes as directed  ____ Use inhalers on the day of surgery  ____ Stop metformin/Janumet/Farxiga 2 days prior to surgery    ____ Take 1/2 of usual insulin dose the night before surgery. No insulin the morning          of surgery.   ____ Stop Blood Thinners Coumadin/Plavix/Xarelto/Pleta/Pradaxa/Eliquis/Effient/Aspirin  on   Or contact your Surgeon, Cardiologist or Medical Doctor regarding  ability to stop your blood thinners  __X__ Stop Anti-inflammatories 7 days before surgery such as Advil, Ibuprofen, Motrin,  BC or Goodies Powder, Naprosyn, Naproxen, Aleve, Aspirin    __X__ Stop all herbal supplements, fish oil or vitamin E until after surgery.    ____ Bring C-Pap to the hospital.

## 2019-01-11 MED ORDER — CLINDAMYCIN PHOSPHATE 900 MG/50ML IV SOLN
900.0000 mg | INTRAVENOUS | Status: AC
  Administered 2019-01-12: 08:00:00 900 mg via INTRAVENOUS

## 2019-01-11 MED ORDER — TRANEXAMIC ACID-NACL 1000-0.7 MG/100ML-% IV SOLN
1000.0000 mg | INTRAVENOUS | Status: AC
  Administered 2019-01-12: 1000 mg via INTRAVENOUS
  Filled 2019-01-11: qty 100

## 2019-01-12 ENCOUNTER — Encounter
Admission: RE | Disposition: A | Discharge status: Home Health | Payer: Self-pay | Source: Home / Self Care | Attending: Orthopedic Surgery

## 2019-01-12 ENCOUNTER — Inpatient Hospital Stay: Payer: Medicare Other

## 2019-01-12 ENCOUNTER — Encounter: Payer: Self-pay | Admitting: *Deleted

## 2019-01-12 ENCOUNTER — Inpatient Hospital Stay: Payer: Medicare Other | Admitting: Certified Registered"

## 2019-01-12 ENCOUNTER — Inpatient Hospital Stay
Admission: RE | Admit: 2019-01-12 | Discharge: 2019-01-13 | DRG: 470 | Disposition: A | Discharge status: Home Health | Payer: Medicare Other | Attending: Orthopedic Surgery | Admitting: Orthopedic Surgery

## 2019-01-12 ENCOUNTER — Other Ambulatory Visit: Payer: Self-pay

## 2019-01-12 DIAGNOSIS — I10 Essential (primary) hypertension: Secondary | ICD-10-CM | POA: Diagnosis present

## 2019-01-12 DIAGNOSIS — M1612 Unilateral primary osteoarthritis, left hip: Principal | ICD-10-CM | POA: Diagnosis present

## 2019-01-12 DIAGNOSIS — Z791 Long term (current) use of non-steroidal anti-inflammatories (NSAID): Secondary | ICD-10-CM | POA: Diagnosis not present

## 2019-01-12 DIAGNOSIS — Z419 Encounter for procedure for purposes other than remedying health state, unspecified: Secondary | ICD-10-CM

## 2019-01-12 DIAGNOSIS — Z881 Allergy status to other antibiotic agents status: Secondary | ICD-10-CM | POA: Diagnosis not present

## 2019-01-12 DIAGNOSIS — Z91048 Other nonmedicinal substance allergy status: Secondary | ICD-10-CM

## 2019-01-12 DIAGNOSIS — Z09 Encounter for follow-up examination after completed treatment for conditions other than malignant neoplasm: Secondary | ICD-10-CM

## 2019-01-12 HISTORY — PX: TOTAL HIP ARTHROPLASTY: SHX124

## 2019-01-12 LAB — ABO/RH: ABO/RH(D): A POS

## 2019-01-12 SURGERY — ARTHROPLASTY, HIP, TOTAL, ANTERIOR APPROACH
Anesthesia: Spinal | Laterality: Left

## 2019-01-12 MED ORDER — MAGNESIUM CITRATE PO SOLN
1.0000 | Freq: Once | ORAL | Status: DC | PRN
  Filled 2019-01-12: qty 296

## 2019-01-12 MED ORDER — BACITRACIN 50000 UNITS IM SOLR
INTRAMUSCULAR | Status: AC
  Filled 2019-01-12: qty 2

## 2019-01-12 MED ORDER — ONDANSETRON HCL 4 MG/2ML IJ SOLN: 4.0000 mg | Freq: Four times a day (QID) | INTRAMUSCULAR | Status: DC | PRN

## 2019-01-12 MED ORDER — HYDROCHLOROTHIAZIDE 25 MG PO TABS
25.0000 mg | ORAL_TABLET | Freq: Every day | ORAL | Status: DC
  Administered 2019-01-12 – 2019-01-13 (×2): 25 mg via ORAL
  Filled 2019-01-12 (×2): qty 1

## 2019-01-12 MED ORDER — CELECOXIB 200 MG PO CAPS
400.0000 mg | ORAL_CAPSULE | Freq: Once | ORAL | Status: AC
  Administered 2019-01-12: 400 mg via ORAL

## 2019-01-12 MED ORDER — KETOROLAC TROMETHAMINE 15 MG/ML IJ SOLN
7.5000 mg | Freq: Four times a day (QID) | INTRAMUSCULAR | Status: AC
  Administered 2019-01-12 – 2019-01-13 (×3): 7.5 mg via INTRAVENOUS
  Filled 2019-01-12 (×4): qty 1

## 2019-01-12 MED ORDER — MORPHINE SULFATE (PF) 2 MG/ML IV SOLN: 0.5000 mg | INTRAVENOUS | Status: DC | PRN

## 2019-01-12 MED ORDER — METOCLOPRAMIDE HCL 10 MG PO TABS: 5.0000 mg | ORAL_TABLET | Freq: Three times a day (TID) | ORAL | Status: DC | PRN

## 2019-01-12 MED ORDER — PROPOFOL 500 MG/50ML IV EMUL
INTRAVENOUS | Status: AC
  Filled 2019-01-12: qty 50

## 2019-01-12 MED ORDER — METOCLOPRAMIDE HCL 5 MG/ML IJ SOLN: 5.0000 mg | Freq: Three times a day (TID) | INTRAMUSCULAR | Status: DC | PRN

## 2019-01-12 MED ORDER — VITAMIN D3 25 MCG (1000 UNIT) PO TABS
1000.0000 [IU] | ORAL_TABLET | Freq: Every day | ORAL | Status: DC
  Administered 2019-01-13: 09:00:00 1000 [IU] via ORAL
  Filled 2019-01-12 (×2): qty 1

## 2019-01-12 MED ORDER — HYDROCODONE-ACETAMINOPHEN 7.5-325 MG PO TABS: 1.0000 | ORAL_TABLET | ORAL | Status: DC | PRN

## 2019-01-12 MED ORDER — MENTHOL 3 MG MT LOZG
1.0000 | LOZENGE | OROMUCOSAL | Status: DC | PRN
  Filled 2019-01-12: qty 9

## 2019-01-12 MED ORDER — ASPIRIN 81 MG PO CHEW
81.0000 mg | CHEWABLE_TABLET | Freq: Two times a day (BID) | ORAL | Status: DC
  Administered 2019-01-12 – 2019-01-13 (×2): 81 mg via ORAL
  Filled 2019-01-12 (×2): qty 1

## 2019-01-12 MED ORDER — LACTATED RINGERS IV SOLN
INTRAVENOUS | Status: DC
  Administered 2019-01-12: 11:00:00 via INTRAVENOUS

## 2019-01-12 MED ORDER — BUPIVACAINE-EPINEPHRINE (PF) 0.25% -1:200000 IJ SOLN
INTRAMUSCULAR | Status: AC
  Filled 2019-01-12: qty 30

## 2019-01-12 MED ORDER — GABAPENTIN 300 MG PO CAPS
300.0000 mg | ORAL_CAPSULE | Freq: Once | ORAL | Status: AC
  Administered 2019-01-12: 07:00:00 300 mg via ORAL

## 2019-01-12 MED ORDER — ACETAMINOPHEN 325 MG PO TABS: 325.0000 mg | ORAL_TABLET | Freq: Four times a day (QID) | ORAL | Status: DC | PRN

## 2019-01-12 MED ORDER — PHENOL 1.4 % MT LIQD
1.0000 | OROMUCOSAL | Status: DC | PRN
  Filled 2019-01-12: qty 177

## 2019-01-12 MED ORDER — CLINDAMYCIN PHOSPHATE 900 MG/50ML IV SOLN
INTRAVENOUS | Status: AC
  Filled 2019-01-12: qty 50

## 2019-01-12 MED ORDER — FENTANYL CITRATE (PF) 100 MCG/2ML IJ SOLN: 25.0000 ug | INTRAMUSCULAR | Status: DC | PRN

## 2019-01-12 MED ORDER — FAMOTIDINE 20 MG PO TABS
ORAL_TABLET | ORAL | Status: AC
  Filled 2019-01-12: qty 1

## 2019-01-12 MED ORDER — GLYCOPYRROLATE 0.2 MG/ML IJ SOLN
INTRAMUSCULAR | Status: AC
  Filled 2019-01-12: qty 1

## 2019-01-12 MED ORDER — MAGNESIUM HYDROXIDE 400 MG/5ML PO SUSP
30.0000 mL | Freq: Every day | ORAL | Status: DC | PRN
  Administered 2019-01-12 – 2019-01-13 (×2): 30 mL via ORAL
  Filled 2019-01-12 (×2): qty 30

## 2019-01-12 MED ORDER — PROPOFOL 500 MG/50ML IV EMUL
INTRAVENOUS | Status: DC | PRN
  Administered 2019-01-12: 25 ug/kg/min via INTRAVENOUS

## 2019-01-12 MED ORDER — FENTANYL CITRATE (PF) 100 MCG/2ML IJ SOLN
INTRAMUSCULAR | Status: DC | PRN
  Administered 2019-01-12 (×2): 50 ug via INTRAVENOUS

## 2019-01-12 MED ORDER — ONDANSETRON HCL 4 MG/2ML IJ SOLN: 4.0000 mg | Freq: Once | INTRAMUSCULAR | Status: DC | PRN

## 2019-01-12 MED ORDER — PHENYLEPHRINE HCL (PRESSORS) 10 MG/ML IV SOLN
INTRAVENOUS | Status: AC
  Filled 2019-01-12: qty 1

## 2019-01-12 MED ORDER — DOCUSATE SODIUM 100 MG PO CAPS
100.0000 mg | ORAL_CAPSULE | Freq: Two times a day (BID) | ORAL | Status: DC
  Administered 2019-01-12 – 2019-01-13 (×2): 100 mg via ORAL
  Filled 2019-01-12 (×2): qty 1

## 2019-01-12 MED ORDER — PROPOFOL 10 MG/ML IV BOLUS
INTRAVENOUS | Status: AC
  Filled 2019-01-12: qty 20

## 2019-01-12 MED ORDER — LACTATED RINGERS IV SOLN
INTRAVENOUS | Status: DC
  Administered 2019-01-12: 07:00:00 via INTRAVENOUS

## 2019-01-12 MED ORDER — SERTRALINE HCL 50 MG PO TABS
50.0000 mg | ORAL_TABLET | Freq: Every day | ORAL | Status: DC
  Administered 2019-01-13: 09:00:00 50 mg via ORAL
  Filled 2019-01-12: qty 1

## 2019-01-12 MED ORDER — FENTANYL CITRATE (PF) 100 MCG/2ML IJ SOLN
INTRAMUSCULAR | Status: AC
  Filled 2019-01-12: qty 2

## 2019-01-12 MED ORDER — ACETAMINOPHEN 500 MG PO TABS
ORAL_TABLET | ORAL | Status: AC
  Administered 2019-01-12: 500 mg
  Filled 2019-01-12: qty 2

## 2019-01-12 MED ORDER — TRAMADOL HCL 50 MG PO TABS
50.0000 mg | ORAL_TABLET | Freq: Four times a day (QID) | ORAL | Status: DC
  Administered 2019-01-12 – 2019-01-13 (×4): 50 mg via ORAL
  Filled 2019-01-12 (×5): qty 1

## 2019-01-12 MED ORDER — HYDROCODONE-ACETAMINOPHEN 5-325 MG PO TABS
1.0000 | ORAL_TABLET | ORAL | Status: DC | PRN
  Administered 2019-01-13: 1 via ORAL
  Filled 2019-01-12: qty 1

## 2019-01-12 MED ORDER — BUPIVACAINE HCL (PF) 0.5 % IJ SOLN
INTRAMUSCULAR | Status: DC | PRN
  Administered 2019-01-12: 2.5 mL

## 2019-01-12 MED ORDER — POVIDONE-IODINE 10 % EX SWAB: 2.0000 "application " | Freq: Once | CUTANEOUS | Status: DC

## 2019-01-12 MED ORDER — CELECOXIB 200 MG PO CAPS
ORAL_CAPSULE | ORAL | Status: AC
  Filled 2019-01-12: qty 2

## 2019-01-12 MED ORDER — GABAPENTIN 300 MG PO CAPS
ORAL_CAPSULE | ORAL | Status: AC
  Filled 2019-01-12: qty 1

## 2019-01-12 MED ORDER — MIDAZOLAM HCL 2 MG/2ML IJ SOLN
INTRAMUSCULAR | Status: AC
  Filled 2019-01-12: qty 2

## 2019-01-12 MED ORDER — ACETAMINOPHEN 500 MG PO TABS
500.0000 mg | ORAL_TABLET | Freq: Four times a day (QID) | ORAL | Status: AC
  Administered 2019-01-12 – 2019-01-13 (×3): 500 mg via ORAL
  Filled 2019-01-12 (×4): qty 1

## 2019-01-12 MED ORDER — ACETAMINOPHEN 500 MG PO TABS
1000.0000 mg | ORAL_TABLET | Freq: Once | ORAL | Status: AC
  Administered 2019-01-12: 1000 mg via ORAL

## 2019-01-12 MED ORDER — LIDOCAINE HCL (PF) 2 % IJ SOLN
INTRAMUSCULAR | Status: AC
  Filled 2019-01-12: qty 10

## 2019-01-12 MED ORDER — GABAPENTIN 300 MG PO CAPS
300.0000 mg | ORAL_CAPSULE | Freq: Three times a day (TID) | ORAL | Status: DC
  Administered 2019-01-12 – 2019-01-13 (×3): 300 mg via ORAL
  Filled 2019-01-12 (×3): qty 1

## 2019-01-12 MED ORDER — BISACODYL 10 MG RE SUPP: 10.0000 mg | Freq: Every day | RECTAL | Status: DC | PRN

## 2019-01-12 MED ORDER — CLINDAMYCIN PHOSPHATE 600 MG/50ML IV SOLN
600.0000 mg | Freq: Four times a day (QID) | INTRAVENOUS | Status: AC
  Administered 2019-01-12 (×2): 600 mg via INTRAVENOUS
  Filled 2019-01-12 (×3): qty 50

## 2019-01-12 MED ORDER — CHLORHEXIDINE GLUCONATE 4 % EX LIQD: 60.0000 mL | Freq: Once | CUTANEOUS | Status: DC

## 2019-01-12 MED ORDER — GLYCOPYRROLATE 0.2 MG/ML IJ SOLN
INTRAMUSCULAR | Status: DC | PRN
  Administered 2019-01-12: 0.2 mg via INTRAVENOUS

## 2019-01-12 MED ORDER — BUPIVACAINE-EPINEPHRINE (PF) 0.25% -1:200000 IJ SOLN
INTRAMUSCULAR | Status: DC | PRN
  Administered 2019-01-12: 20 mL via PERINEURAL

## 2019-01-12 MED ORDER — FAMOTIDINE 20 MG PO TABS: 20.0000 mg | ORAL_TABLET | Freq: Once | ORAL | Status: DC

## 2019-01-12 MED ORDER — BUPIVACAINE HCL (PF) 0.5 % IJ SOLN
INTRAMUSCULAR | Status: AC
  Filled 2019-01-12: qty 10

## 2019-01-12 MED ORDER — ONDANSETRON HCL 4 MG/2ML IJ SOLN
INTRAMUSCULAR | Status: AC
  Filled 2019-01-12: qty 2

## 2019-01-12 MED ORDER — SODIUM CHLORIDE 0.9 % IR SOLN
Status: DC | PRN
  Administered 2019-01-12: 09:00:00 1000 mL

## 2019-01-12 MED ORDER — ALUM & MAG HYDROXIDE-SIMETH 200-200-20 MG/5ML PO SUSP: 30.0000 mL | ORAL | Status: DC | PRN

## 2019-01-12 MED ORDER — ONDANSETRON HCL 4 MG PO TABS: 4.0000 mg | ORAL_TABLET | Freq: Four times a day (QID) | ORAL | Status: DC | PRN

## 2019-01-12 MED ORDER — ONDANSETRON HCL 4 MG/2ML IJ SOLN
INTRAMUSCULAR | Status: DC | PRN
  Administered 2019-01-12: 4 mg via INTRAVENOUS

## 2019-01-12 MED ORDER — MIDAZOLAM HCL 5 MG/5ML IJ SOLN
INTRAMUSCULAR | Status: DC | PRN
  Administered 2019-01-12 (×2): 1 mg via INTRAVENOUS

## 2019-01-12 SURGICAL SUPPLY — 48 items
BLADE SAGITTAL WIDE XTHICK NO (BLADE) ×3 IMPLANT
BRUSH SCRUB EZ  4% CHG (MISCELLANEOUS) ×4
BRUSH SCRUB EZ 4% CHG (MISCELLANEOUS) ×2 IMPLANT
CHLORAPREP W/TINT 26 (MISCELLANEOUS) ×3 IMPLANT
COVER HOLE (Hips) ×2 IMPLANT
COVER WAND RF STERILE (DRAPES) ×3 IMPLANT
CUP R3 50MM (Hips) ×2 IMPLANT
DRAPE 3/4 80X56 (DRAPES) ×3 IMPLANT
DRAPE C-ARM 42X72 X-RAY (DRAPES) ×3 IMPLANT
DRAPE STERI IOBAN 125X83 (DRAPES) IMPLANT
DRSG AQUACEL AG ADV 3.5X10 (GAUZE/BANDAGES/DRESSINGS) ×2 IMPLANT
DRSG AQUACEL AG ADV 3.5X14 (GAUZE/BANDAGES/DRESSINGS) IMPLANT
ELECT BLADE 6.5 EXT (BLADE) ×3 IMPLANT
ELECT REM PT RETURN 9FT ADLT (ELECTROSURGICAL) ×3
ELECTRODE REM PT RTRN 9FT ADLT (ELECTROSURGICAL) ×1 IMPLANT
GAUZE XEROFORM 1X8 LF (GAUZE/BANDAGES/DRESSINGS) IMPLANT
GLOVE INDICATOR 8.0 STRL GRN (GLOVE) ×3 IMPLANT
GLOVE SURG ORTHO 8.0 STRL STRW (GLOVE) ×6 IMPLANT
GOWN STRL REUS W/ TWL LRG LVL3 (GOWN DISPOSABLE) ×1 IMPLANT
GOWN STRL REUS W/ TWL XL LVL3 (GOWN DISPOSABLE) ×1 IMPLANT
GOWN STRL REUS W/TWL LRG LVL3 (GOWN DISPOSABLE) ×2
GOWN STRL REUS W/TWL XL LVL3 (GOWN DISPOSABLE) ×2
HEAD OXINIUM PLUS 0 32MM (Hips) ×2 IMPLANT
HOOD PEEL AWAY FLYTE STAYCOOL (MISCELLANEOUS) ×9 IMPLANT
IV NS 1000ML (IV SOLUTION) ×2
IV NS 1000ML BAXH (IV SOLUTION) ×1 IMPLANT
KIT PATIENT CARE HANA TABLE (KITS) ×3 IMPLANT
KIT TURNOVER CYSTO (KITS) ×3 IMPLANT
LINER 0 DEG 32X50MM (Hips) ×2 IMPLANT
MAT ABSORB  FLUID 56X50 GRAY (MISCELLANEOUS) ×2
MAT ABSORB FLUID 56X50 GRAY (MISCELLANEOUS) ×1 IMPLANT
NDL SAFETY ECLIPSE 18X1.5 (NEEDLE) ×2 IMPLANT
NDL SPNL 20GX3.5 QUINCKE YW (NEEDLE) ×1 IMPLANT
NEEDLE HYPO 18GX1.5 SHARP (NEEDLE) ×4
NEEDLE HYPO 22GX1.5 SAFETY (NEEDLE) ×3 IMPLANT
NEEDLE SPNL 20GX3.5 QUINCKE YW (NEEDLE) ×3 IMPLANT
PACK HIP PROSTHESIS (MISCELLANEOUS) ×3 IMPLANT
PADDING CAST BLEND 4X4 NS (MISCELLANEOUS) ×6 IMPLANT
PILLOW ABDUCTION MEDIUM (MISCELLANEOUS) ×3 IMPLANT
PULSAVAC PLUS IRRIG FAN TIP (DISPOSABLE) ×3
STAPLER SKIN PROX 35W (STAPLE) ×3 IMPLANT
STEM STD COLLAR SZ2 POLARSTEM (Stem) ×2 IMPLANT
SUT BONE WAX W31G (SUTURE) ×3 IMPLANT
SUT DVC 2 QUILL PDO  T11 36X36 (SUTURE) ×2
SUT DVC 2 QUILL PDO T11 36X36 (SUTURE) ×1 IMPLANT
SUT VIC AB 2-0 CT1 18 (SUTURE) ×3 IMPLANT
SYR 20ML LL LF (SYRINGE) ×3 IMPLANT
TIP FAN IRRIG PULSAVAC PLUS (DISPOSABLE) ×1 IMPLANT

## 2019-01-12 NOTE — H&P (Signed)
The patient has been re-examined, and the chart reviewed, and there have been no interval changes to the documented history and physical.  Plan a left total hip replacement today.  Anesthesia is not consulted regarding a peripheral nerve block for post-operative pain.  The risks, benefits, and alternatives have been discussed at length, and the patient is willing to proceed.

## 2019-01-12 NOTE — Op Note (Signed)
01/12/2019  9:28 AM  PATIENT:  Michele Dean   MRN: [id number]  PRE-OPERATIVE DIAGNOSIS:  Osteoarthritis left hip   POST-OPERATIVE DIAGNOSIS: Same  Procedure: Left Total Hip Replacement  Surgeon: Elyn Aquas. Harlow Mares, MD   Assist: Carlynn Spry, PA-C  Anesthesia: Spinal   EBL: 100 mL   Specimens: None   Drains: None   Components used: A size 2 Polarstem [employer], R3 size 50 mm shell, and a 32 mm by +0 mm head    Description of the procedure in detail: After informed consent was obtained and the appropriate extremity marked in the pre-operative holding area, the patient was taken to the operating room and placed in the supine position on the fracture table. All pressure points were well padded and bilateral lower extremities were place in traction spars. The hip was prepped and draped in standard sterile fashion. A spinal anesthetic had been delivered by the anesthesia team. The skin and subcutaneous tissues were injected with a mixture of Marcaine with epinephrine for post-operative pain. A longitudinal incision approximately 10 cm in length was carried out from the anterior superior iliac spine to the greater trochanter. The tensor fascia was divided and blunt dissection was taken down to the level of the joint capsule. The lateral circumflex vessels were cauterized. Deep retractors were placed and a portion of the anterior capsule was excised. Using fluoroscopy the neck cut was planned and carried out with a sagittal saw. The head was passed from the field with use of a corkscrew and hip skid. Deep retractors were placed along the acetabulum and the degenerative labrum and large osteophytes were removed with a Rongeur. The cup was sequentially reamed to a size 50 mm. The wound was irrigated and using fluoroscopy the size 50 mm cup was impacted in to anatomic position. A single screw was placed followed by a threaded hole cover. The final liner was impacted in to position. Attention  was then turned to the proximal femur. The leg was placed in extension and external rotation. The canal was opened and sequentially broached to a size 2. The trial components were placed and the hip relocated. The components were found to be in good position using fluoroscopy. The hip was dislocated and the trial components removed. The final components were impacted in to position and the hip relocated. The final components were again check with fluoroscopy and found to be in good position. Hemostasis was achieved with electrocautery. The deep capsule was injected with Marcaine and epinephrine. The wound was irrigated with bacitracin laced normal saline and the tensor fascia closed with #2 Quill suture. The subcutaneous tissues were closed with 2-0 vicryl and staples for the skin. A sterile dressing was applied and an abduction pillow. Patient tolerated the procedure well and there were no apparent complication. Patient was taken to the recovery room in good condition.   Kurtis Bushman, MD

## 2019-01-12 NOTE — Evaluation (Signed)
Physical Therapy Evaluation Patient Details Name: Michele Dean MRN: [id number] DOB: 19-Feb-1946 Today's Date: 01/12/2019   History of Present Illness  Patient is 73 yo female s/p LTHA, direct anterior approach, WBAT. PMH of HTN, anxiety, obese  Clinical Impression  Patient oriented, alert, behavior WFLs, pt in great spirits, sensation returned and pt able to DF/PF on command. Pt reported living with her husband in a one story home, previously independent.   Patient demonstrated bed level therapeutic exercises with verbal and visual cues, tactile assist for SLR but able to complete without physical assist. Supine to sit with step by step sequencing, minA. Sit <> stand with RW and CGA, several transfers performed and pt needed repetitive RW management instruction including hand placement to improve safety. Pt ambulated ~38ft, exhibited improved step through gait pattern and LLE weight bearing with time.  Overall the patient demonstrated deficits (see "PT Problem List") that impede the patient's functional abilities, safety, and mobility and would benefit from skilled PT intervention. Recommendation is HHPT.     Follow Up Recommendations Home health PT    Equipment Recommendations  Rolling walker with 5" wheels    Recommendations for Other Services       Precautions / Restrictions Precautions Precautions: Anterior Hip Precaution Booklet Issued: Yes (comment) Restrictions Weight Bearing Restrictions: Yes LLE Weight Bearing: Weight bearing as tolerated      Mobility  Bed Mobility                  Transfers Overall transfer level: Needs assistance Equipment used: Rolling walker (2 wheeled) Transfers: Sit to/from Stand Sit to Stand: Min guard         General transfer comment: cues for hand placement  Ambulation/Gait Ambulation/Gait assistance: Min guard Gait Distance (Feet): 22 Feet Assistive device: Rolling walker (2 wheeled)       General Gait Details:  progressed to step through gait pattern and improved LE weight bearing with time  Stairs            Wheelchair Mobility    Modified Rankin (Stroke Patients Only)       Balance Overall balance assessment: Needs assistance Sitting-balance support: Feet supported Sitting balance-Leahy Scale: Good       Standing balance-Leahy Scale: Fair Standing balance comment: able to wash hands at sink                             Pertinent Vitals/Pain Pain Assessment: No/denies pain    Home Living Family/patient expects to be discharged to:: Private residence Living Arrangements: Spouse/significant other Available Help at Discharge: Family Type of Home: House Home Access: Stairs to enter Entrance Stairs-Rails: Right;Left;Can reach both Technical brewer of Steps: 5-6 Home Layout: One level Home Equipment: Grab bars - toilet      Prior Function Level of Independence: Independent               Hand Dominance        Extremity/Trunk Assessment   Upper Extremity Assessment Upper Extremity Assessment: Overall WFL for tasks assessed    Lower Extremity Assessment Lower Extremity Assessment: RLE deficits/detail;LLE deficits/detail RLE Deficits / Details: WFLs LLE Deficits / Details: s/p L THA, able to perform SLR    Cervical / Trunk Assessment Cervical / Trunk Assessment: Normal  Communication   Communication: No difficulties  Cognition Arousal/Alertness: Awake/alert Behavior During Therapy: WFL for tasks assessed/performed Overall Cognitive Status: Within Functional Limits for tasks assessed  General Comments      Exercises Total Joint Exercises Ankle Circles/Pumps: AROM;15 reps;Both Quad Sets: Both;AROM;15 reps Gluteal Sets: AROM;Both;15 reps Heel Slides: AAROM;Left;15 reps;Right Hip ABduction/ADduction: AROM;Both;15 reps Straight Leg Raises: AROM;Left;10 reps Other Exercises Other  Exercises: Pt demonstrated commode transfer (BSC overtop) cues for hand placement, use of grab bar   Assessment/Plan    PT Assessment Patient needs continued PT services  PT Problem List Decreased strength;Decreased mobility;Decreased range of motion;Decreased activity tolerance;Decreased balance;Decreased knowledge of use of DME;Pain       PT Treatment Interventions DME instruction;Therapeutic exercise;Gait training;Balance training;Stair training;Neuromuscular re-education;Functional mobility training;Therapeutic activities;Patient/family education    PT Goals (Current goals can be found in the Care Plan section)  Acute Rehab PT Goals Patient Stated Goal: to go home PT Goal Formulation: With patient Time For Goal Achievement: 01/26/19 Potential to Achieve Goals: Good    Frequency BID   Barriers to discharge        Co-evaluation               AM-PAC PT "6 Clicks" Mobility  Outcome Measure Help needed turning from your back to your side while in a flat bed without using bedrails?: A Little Help needed moving from lying on your back to sitting on the side of a flat bed without using bedrails?: A Little Help needed moving to and from a bed to a chair (including a wheelchair)?: A Little Help needed standing up from a chair using your arms (e.g., wheelchair or bedside chair)?: A Little Help needed to walk in hospital room?: A Little Help needed climbing 3-5 steps with a railing? : A Little 6 Click Score: 18    End of Session Equipment Utilized During Treatment: Gait belt Activity Tolerance: Patient tolerated treatment well Patient left: in chair;with chair alarm set;with call bell/phone within reach;with SCD's reapplied Nurse Communication: Mobility status PT Visit Diagnosis: Unsteadiness on feet (R26.81);Muscle weakness (generalized) (M62.81);Other abnormalities of gait and mobility (R26.89);Pain;Difficulty in walking, not elsewhere classified (R26.2) Pain - Right/Left:  Left Pain - part of body: Hip    Time: 8889-1694 PT Time Calculation (min) (ACUTE ONLY): 48 min   Charges:   PT Evaluation $PT Eval Low Complexity: 1 Low PT Treatments $Therapeutic Exercise: 23-37 mins $Therapeutic Activity: 8-22 mins        Lieutenant Diego PT, DPT 4:34 PM,01/12/19 [phone]

## 2019-01-12 NOTE — Transfer of Care (Signed)
Immediate Anesthesia Transfer of Care Note  Patient: Michele Dean  Procedure(s) Performed: TOTAL HIP ARTHROPLASTY ANTERIOR APPROACH (Left )  Patient Location: PACU  Anesthesia Type:Spinal  Level of Consciousness: awake, alert  and oriented  Airway & Oxygen Therapy: Patient Spontanous Breathing  Post-op Assessment: Report given to RN and Post -op Vital signs reviewed and stable  Post vital signs: Reviewed  Last Vitals:  Vitals Value Taken Time  BP 101/54 01/12/19 0929  Temp 37.2 C 01/12/19 0929  Pulse 82 01/12/19 0930  Resp 8 01/12/19 0930  SpO2 100 % 01/12/19 0930  Vitals shown include unvalidated device data.  Last Pain:  Vitals:   01/12/19 0621  TempSrc: Temporal  PainSc: 3          Complications: No apparent anesthesia complications

## 2019-01-12 NOTE — Progress Notes (Signed)
Patient up with PT for therapy. Tolerated well. Patient ambulated to bathroom and was able to void significantly. Patient offers no c/o

## 2019-01-12 NOTE — Anesthesia Post-op Follow-up Note (Signed)
Anesthesia QCDR form completed.        

## 2019-01-12 NOTE — Anesthesia Preprocedure Evaluation (Signed)
Anesthesia Evaluation  Patient identified by MRN, date of birth, ID band Patient awake    Reviewed: Allergy & Precautions, NPO status , Patient's Chart, lab work & pertinent test results  History of Anesthesia Complications Negative for: history of anesthetic complications  Airway Mallampati: II  TM Distance: >3 FB Neck ROM: Full    Dental  (+) Caps   Pulmonary neg pulmonary ROS, neg sleep apnea, neg COPD,    breath sounds clear to auscultation- rhonchi (-) wheezing      Cardiovascular hypertension, Pt. on medications (-) CAD, (-) Past MI, (-) Cardiac Stents and (-) CABG  Rhythm:Regular Rate:Normal - Systolic murmurs and - Diastolic murmurs    Neuro/Psych neg Seizures Anxiety negative neurological ROS     GI/Hepatic negative GI ROS, Neg liver ROS,   Endo/Other  negative endocrine ROSneg diabetes  Renal/GU negative Renal ROS     Musculoskeletal  (+) Arthritis ,   Abdominal (+) + obese,   Peds  Hematology negative hematology ROS (+)   Anesthesia Other Findings Past Medical History: No date: Allergy No date: Anxiety No date: Arthritis 2001: Colon polyp 2009: Diverticulitis No date: Family history of colon cancer     Comment:  mother No date: Hypertension No date: Insomnia   Reproductive/Obstetrics                             Lab Results  Component Value Date   WBC 10.6 (H) 01/07/2019   HGB 13.9 01/07/2019   HCT 41.5 01/07/2019   MCV 94.7 01/07/2019   PLT 282 01/07/2019    Anesthesia Physical Anesthesia Plan  ASA: II  Anesthesia Plan: Spinal   Post-op Pain Management:    Induction:   PONV Risk Score and Plan: 2 and Propofol infusion  Airway Management Planned: Natural Airway  Additional Equipment:   Intra-op Plan:   Post-operative Plan:   Informed Consent: I have reviewed the patients History and Physical, chart, labs and discussed the procedure including the  risks, benefits and alternatives for the proposed anesthesia with the patient or authorized representative who has indicated his/her understanding and acceptance.     Dental advisory given  Plan Discussed with: CRNA and Anesthesiologist  Anesthesia Plan Comments:         Anesthesia Quick Evaluation

## 2019-01-12 NOTE — Anesthesia Procedure Notes (Signed)
Spinal  Patient location during procedure: OR Start time: 01/12/2019 7:38 AM End time: 01/12/2019 7:42 AM Staffing Anesthesiologist: Emmie Niemann, MD Resident/CRNA: Rolla Plate, CRNA Performed: resident/CRNA  Preanesthetic Checklist Completed: patient identified, site marked, surgical consent, pre-op evaluation, timeout performed, IV checked, risks and benefits discussed and monitors and equipment checked Spinal Block Patient position: sitting Prep: ChloraPrep Patient monitoring: heart rate, continuous pulse ox and blood pressure Approach: midline Location: L4-5 Injection technique: single-shot Needle Needle type: Introducer and Pencil-Tip  Needle gauge: 24 G Needle length: 9 cm Additional Notes Negative paresthesia. Negative blood return. Positive free-flowing CSF. Expiration date of kit checked and confirmed. Patient tolerated procedure well, without complications.

## 2019-01-12 NOTE — Progress Notes (Signed)
Spoke with son of patient , Eustace Moore to let him know that pt arrived to unit and is settled in.

## 2019-01-12 NOTE — Progress Notes (Signed)
Bladder scan done  222ml

## 2019-01-12 NOTE — Progress Notes (Signed)
Bladder scan done 221 ml

## 2019-01-13 LAB — BASIC METABOLIC PANEL
Anion gap: 3 — ABNORMAL LOW (ref 5–15)
BUN: 12 mg/dL (ref 8–23)
CO2: 30 mmol/L (ref 22–32)
Calcium: 8.2 mg/dL — ABNORMAL LOW (ref 8.9–10.3)
Chloride: 98 mmol/L (ref 98–111)
Creatinine, Ser: 0.47 mg/dL (ref 0.44–1.00)
GFR calc Af Amer: >60 mL/min (ref >60)
GFR calc non Af Amer: >60 mL/min (ref >60)
Glucose, Bld: 111 mg/dL — ABNORMAL HIGH (ref 70–99)
Potassium: 3.5 mmol/L (ref 3.5–5.1)
Sodium: 131 mmol/L — ABNORMAL LOW (ref 135–145)

## 2019-01-13 LAB — CBC
HCT: 33.7 % — ABNORMAL LOW (ref 36.0–46.0)
Hemoglobin: 11.1 g/dL — ABNORMAL LOW (ref 12.0–15.0)
MCH: 32.1 pg (ref 26.0–34.0)
MCHC: 32.9 g/dL (ref 30.0–36.0)
MCV: 97.4 fL (ref 80.0–100.0)
Platelets: 182 10*3/uL (ref 150–400)
RBC: 3.46 MIL/uL — ABNORMAL LOW (ref 3.87–5.11)
RDW: 12.2 % (ref 11.5–15.5)
WBC: 8.1 10*3/uL (ref 4.0–10.5)
nRBC: 0 % (ref 0.0–0.2)

## 2019-01-13 LAB — SURGICAL PATHOLOGY

## 2019-01-13 MED ORDER — ASPIRIN 81 MG PO CHEW: 81.0000 mg | CHEWABLE_TABLET | Freq: Two times a day (BID) | ORAL | 0 refills | Status: AC

## 2019-01-13 MED ORDER — DOCUSATE SODIUM 100 MG PO CAPS: 100.0000 mg | ORAL_CAPSULE | Freq: Every day | ORAL | 2 refills | Status: AC | PRN

## 2019-01-13 MED ORDER — TRAMADOL HCL 50 MG PO TABS: 50.0000 mg | ORAL_TABLET | Freq: Four times a day (QID) | ORAL | 0 refills | Status: AC | PRN

## 2019-01-13 NOTE — TOC Transition Note (Signed)
Transition of Care [facility]) - CM/SW Discharge Note   Patient Details  Name: Michele Dean MRN: [id number] Date of Birth: 11/20/1945  Transition of Care [facility]) CM/SW Contact:  Su Hilt, RN Phone Number: 01/13/2019, 2:05 PM   Clinical Narrative:     Met with the patient to discuss DC plan and needs She lives at home with her spouse and her adult son is help her  She has a rollator at home and a [facility], She iwll need a RW and I contacted Leroy Sea and he will bring a RW to the room before she leaves She has transportation with her son and husband She sees Dr Hall Busing for PCP Pharmacy is [facility] and she can afford her medications She has an appointment already set up with Outpatient PT for next week and declines HH services No further needs  Final next level of care: Home/Self Care Barriers to Discharge: Barriers Resolved   Patient Goals and CMS Choice Patient states their goals for this hospitalization and ongoing recovery are:: go home CMS Medicare.gov Compare Post Acute Care list provided to:: Patient Choice offered to / list presented to : Patient  Discharge Placement                       Discharge Plan and Services   Discharge Planning Services: CM Consult Post Acute Care Choice: Home Health          DME Arranged: Walker rolling DME Agency: [employer] Date DME Agency Contacted: 01/13/19 Time DME Agency Contacted: [phone] Representative spoke with at DME Agency: [place]: NA          Social Determinants of Health ([place]) Interventions     Readmission Risk Interventions No flowsheet data found.

## 2019-01-13 NOTE — Progress Notes (Signed)
Physical Therapy Treatment Patient Details Name: Michele Dean MRN: [id number] DOB: 1945/09/26 Today's Date: 01/13/2019    History of Present Illness Patient is 73 yo female s/p LTHA, direct anterior approach, WBAT. PMH of HTN, anxiety, obese    PT Comments    Patient up in chair,eager for PT, reported some L hip stiffness when up with PT but no other complaints. Pt ambulated ~243ft with RW and supervision, of note 1 LOB when pt distracted,minA from PT to correct and no other issues when pt attending to task. Pt demonstrated stair training with CGA and excellent sequencing/technique with little verbal cues after initial instruction. Pt up in chair at end of session, all needs in reach. The patient continues to demonstrate progression towards goals and would benefit from further skilled PT to return to PLOF as able.     Follow Up Recommendations  Outpatient PT     Equipment Recommendations  Rolling walker with 5" wheels    Recommendations for Other Services       Precautions / Restrictions Precautions Precautions: Anterior Hip Precaution Booklet Issued: Yes (comment) Restrictions Weight Bearing Restrictions: Yes LLE Weight Bearing: Weight bearing as tolerated    Mobility  Bed Mobility Overal bed mobility: Needs Assistance Bed Mobility: Supine to Sit     Supine to sit: Independent     General bed mobility comments: deferred up in chiar  Transfers Overall transfer level: Needs assistance Equipment used: Rolling walker (2 wheeled) Transfers: Sit to/from Stand Sit to Stand: Supervision         General transfer comment: one instance of hand placement cues for safety  Ambulation/Gait Ambulation/Gait assistance: Supervision Gait Distance (Feet): 220 Feet Assistive device: Rolling walker (2 wheeled)       General Gait Details: Pt with one LOB when distracted, minA from PT to correct, supervision for remainder of session.   Stairs Stairs: Yes Stairs  assistance: Min guard Stair Management: Two rails Number of Stairs: 8 General stair comments: Pt performed very well, CGA throughout, good recall of proper sequencing   Wheelchair Mobility    Modified Rankin (Stroke Patients Only)       Balance Overall balance assessment: Needs assistance Sitting-balance support: Feet supported Sitting balance-Leahy Scale: Good       Standing balance-Leahy Scale: Fair Standing balance comment: able to wash hands at sink                            Cognition Arousal/Alertness: Awake/alert Behavior During Therapy: WFL for tasks assessed/performed Overall Cognitive Status: Within Functional Limits for tasks assessed                                        Exercises Total Joint Exercises Ankle Circles/Pumps: AROM;15 reps;Both Quad Sets: Both;AROM;15 reps Gluteal Sets: AROM;Both;15 reps Short Arc Quad: AROM;Left;15 reps Heel Slides: AROM;Left;15 reps Hip ABduction/ADduction: AROM;Left;15 reps Straight Leg Raises: AROM;Left;15 reps Long Arc Quad: AROM;Left;15 reps Goniometric ROM: seated marching x15 bilaterally Other Exercises Other Exercises: Pt demonstrated commode transfer (BSC overtop) cues for hand placement, use of grab bar, supervision    General Comments        Pertinent Vitals/Pain Pain Assessment: No/denies pain Pain Score: 2  Pain Location: L hip Pain Descriptors / Indicators: (stiff) Pain Intervention(s): Limited activity within patient's tolerance;Repositioned;Monitored during session    Home Living  Prior Function            PT Goals (current goals can now be found in the care plan section) Progress towards PT goals: Progressing toward goals    Frequency    BID      PT Plan Current plan remains appropriate    Co-evaluation              AM-PAC PT "6 Clicks" Mobility   Outcome Measure  Help needed turning from your back to your side while  in a flat bed without using bedrails?: None Help needed moving from lying on your back to sitting on the side of a flat bed without using bedrails?: None Help needed moving to and from a bed to a chair (including a wheelchair)?: None Help needed standing up from a chair using your arms (e.g., wheelchair or bedside chair)?: None Help needed to walk in hospital room?: None Help needed climbing 3-5 steps with a railing? : A Little 6 Click Score: 23    End of Session Equipment Utilized During Treatment: Gait belt Activity Tolerance: Patient tolerated treatment well Patient left: in chair;with chair alarm set;with call bell/phone within reach;with SCD's reapplied Nurse Communication: Mobility status PT Visit Diagnosis: Unsteadiness on feet (R26.81);Muscle weakness (generalized) (M62.81);Other abnormalities of gait and mobility (R26.89);Pain;Difficulty in walking, not elsewhere classified (R26.2) Pain - Right/Left: Left Pain - part of body: Hip     Time: 1310-1344 PT Time Calculation (min) (ACUTE ONLY): 34 min  Charges:  $Gait Training: 8-22 mins $Therapeutic Exercise: 8-22 mins $Therapeutic Activity: 8-22 mins                     Lieutenant Diego PT, DPT 2:36 PM,01/13/19 [phone]

## 2019-01-13 NOTE — Progress Notes (Signed)
Physical Therapy Treatment Patient Details Name: Michele Dean MRN: [id number] DOB: 02/11/1946 Today's Date: 01/13/2019    History of Present Illness Patient is 73 yo female s/p LTHA, direct anterior approach, WBAT. PMH of HTN, anxiety, obese    PT Comments    Patient alert, eager to mobilize, no complaints of pain at rest, 2/10 pain in hip with ambulation. Pt demonstrated improved ability to perform bed level therapeutic exercises without physical assist, excellent SLR on L. Supine to sit from flat bed independently and transferred several times this session with supervision and 1 reminder of hand placement to optimize safety. Pt ambulated ~278ft with RW and supervision, progressed weight bearing through LE today as well as gait velocity. Overall the patient demonstrated excellent progress towards goals. Discharge planning updated to outpatient PT, pt reported that she has already had an appointment set up. PM session for stair training.       Follow Up Recommendations  Outpatient PT     Equipment Recommendations  Rolling walker with 5" wheels(Pt stated she will be borrowing a RW from a family member if she can)    Recommendations for Other Services       Precautions / Restrictions Precautions Precautions: Anterior Hip Restrictions Weight Bearing Restrictions: Yes LLE Weight Bearing: Weight bearing as tolerated    Mobility  Bed Mobility Overal bed mobility: Needs Assistance Bed Mobility: Supine to Sit     Supine to sit: Independent        Transfers Overall transfer level: Needs assistance Equipment used: Rolling walker (2 wheeled) Transfers: Sit to/from Stand Sit to Stand: Supervision            Ambulation/Gait Ambulation/Gait assistance: Supervision Gait Distance (Feet): 190 Feet Assistive device: Rolling walker (2 wheeled)       General Gait Details: Pt with improved gait velocity and weight bearing through LEs this session, able to increase ambulation  distance without complaint   Stairs             Wheelchair Mobility    Modified Rankin (Stroke Patients Only)       Balance Overall balance assessment: Needs assistance Sitting-balance support: Feet supported Sitting balance-Leahy Scale: Good       Standing balance-Leahy Scale: Fair                              Cognition Arousal/Alertness: Awake/alert Behavior During Therapy: WFL for tasks assessed/performed Overall Cognitive Status: Within Functional Limits for tasks assessed                                        Exercises Total Joint Exercises Ankle Circles/Pumps: AROM;15 reps;Both Quad Sets: Both;AROM;15 reps Gluteal Sets: AROM;Both;15 reps Heel Slides: AROM;Left;15 reps Hip ABduction/ADduction: AROM;Left;15 reps Straight Leg Raises: AROM;Left;15 reps Other Exercises Other Exercises: Pt demonstrated commode transfer (BSC overtop) cues for hand placement, use of grab bar, supervision    General Comments        Pertinent Vitals/Pain Pain Assessment: 0-10 Pain Score: 2  Pain Location: L hip Pain Intervention(s): Limited activity within patient's tolerance;Repositioned;Monitored during session    Home Living                      Prior Function            PT Goals (current goals can now be  found in the care plan section) Progress towards PT goals: Progressing toward goals    Frequency    BID      PT Plan Discharge plan needs to be updated    Co-evaluation              AM-PAC PT "6 Clicks" Mobility   Outcome Measure  Help needed turning from your back to your side while in a flat bed without using bedrails?: None Help needed moving from lying on your back to sitting on the side of a flat bed without using bedrails?: None Help needed moving to and from a bed to a chair (including a wheelchair)?: None Help needed standing up from a chair using your arms (e.g., wheelchair or bedside chair)?:  None Help needed to walk in hospital room?: None Help needed climbing 3-5 steps with a railing? : A Little 6 Click Score: 23    End of Session Equipment Utilized During Treatment: Gait belt Activity Tolerance: Patient tolerated treatment well Patient left: in chair;with chair alarm set;with call bell/phone within reach;with SCD's reapplied Nurse Communication: Mobility status PT Visit Diagnosis: Unsteadiness on feet (R26.81);Muscle weakness (generalized) (M62.81);Other abnormalities of gait and mobility (R26.89);Pain;Difficulty in walking, not elsewhere classified (R26.2) Pain - Right/Left: Left Pain - part of body: Hip     Time: 0912-0950 PT Time Calculation (min) (ACUTE ONLY): 38 min  Charges:  $Gait Training: 8-22 mins $Therapeutic Exercise: 23-37 mins                    Lieutenant Diego PT, DPT 10:54 AM,01/13/19 [phone]

## 2019-01-13 NOTE — Discharge Instructions (Signed)

## 2019-01-13 NOTE — Progress Notes (Signed)
Patient ready for discharge to home. Family providing transportation. Hydrocolloid dressing in tact to left hip surgical site.  All personal belongings with patient. All discharge instructions reviewed with patient. Meds, follow  Up care, incision care. Patient will participate in out patient therapy. Patient to call Dr Marica Otter office to notify for follow up appt.

## 2019-01-13 NOTE — Anesthesia Postprocedure Evaluation (Signed)
Anesthesia Post Note  Patient: Michele Dean  Procedure(s) Performed: TOTAL HIP ARTHROPLASTY ANTERIOR APPROACH (Left )  Patient location during evaluation: Nursing Unit Anesthesia Type: Spinal Level of consciousness: awake and alert and oriented Pain management: pain level controlled Vital Signs Assessment: post-procedure vital signs reviewed and stable Respiratory status: nonlabored ventilation, spontaneous breathing and respiratory function stable Cardiovascular status: stable Postop Assessment: no headache, no backache, patient able to bend at knees, no apparent nausea or vomiting, adequate PO intake and able to ambulate Anesthetic complications: no     Last Vitals:  Vitals:   01/13/19 0117 01/13/19 0435  BP: 120/80 125/69  Pulse: 68 71  Resp: 16 16  Temp: 36.8 C 36.8 C  SpO2: 99% 100%    Last Pain:  Vitals:   01/13/19 0435  TempSrc: Oral  PainSc:                  [name]

## 2019-01-13 NOTE — Discharge Summary (Signed)
Physician Discharge Summary  Patient ID: Michele Dean MRN: [id number] DOB/AGE: [birth year] 73 y.o.  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 Allergy   . Anxiety   . Arthritis   . Colon polyp 2001  . Diverticulitis 2009  . Family history of colon cancer    mother  . Hypertension   . Insomnia     Surgeries: Procedure(s): TOTAL HIP ARTHROPLASTY ANTERIOR APPROACH on 01/12/2019   Consultants (if any):   Discharged Condition: Improved  Hospital Course: Michele Dean is an 73 y.o. 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    She was given perioperative antibiotics:  Anti-infectives (From admission, onward)   Start     Dose/Rate Route Frequency Ordered Stop   01/12/19 1400  clindamycin (CLEOCIN) IVPB 600 mg     600 mg 100 mL/hr over 30 Minutes Intravenous Every 6 hours 01/12/19 1113 01/12/19 2052   01/12/19 0908  50,000 units bacitracin in 0.9% normal saline 250 mL irrigation  Status:  Discontinued       As needed 01/12/19 0909 01/12/19 0925   01/12/19 0636  clindamycin (CLEOCIN) 900 MG/50ML IVPB    Note to Pharmacy: Lorenza Cambridge   : cabinet override      01/12/19 0636 01/12/19 0747   01/12/19 0600  clindamycin (CLEOCIN) IVPB 900 mg     900 mg 100 mL/hr over 30 Minutes Intravenous On call to O.R. 01/11/19 2137 01/12/19 2694    .  She was given sequential compression devices, early ambulation, and aspirin for DVT prophylaxis.  She benefited maximally from the hospital stay and there were no complications.     Recent vital signs:  Vitals:   01/13/19 0435 01/13/19 0803  BP: 125/69 (!) 116/58  Pulse: 71 69  Resp: 16 16  Temp: 98.3 F (36.8 C) 98.9 F (37.2 C)  SpO2: 100% 98%    Recent laboratory studies:  Lab Results  Component Value Date   HGB 11.1 (L) 01/13/2019   HGB 13.9 01/07/2019   HGB 14.6 07/22/2013   Lab Results  Component Value Date   WBC 8.1 01/13/2019   PLT 182 01/13/2019   Lab Results  Component Value Date   INR 0.9 01/07/2019   Lab Results  Component Value Date   NA 131 (L) 01/13/2019   K 3.5 01/13/2019   CL 98 01/13/2019   CO2 30 01/13/2019   BUN 12 01/13/2019   CREATININE 0.47 01/13/2019   GLUCOSE 111 (H) 01/13/2019    Discharge Medications:   Allergies as of 01/13/2019      Reactions   Adhesive [tape] Itching   Ceftin [cefuroxime] Itching      Medication List    TAKE these medications   acetaminophen 500 MG tablet Commonly known as: TYLENOL Take 1,000 mg by mouth every 6 (six) hours as needed for mild pain or headache.   aspirin 81 MG chewable tablet Chew 1 tablet (81 mg total) by mouth 2 (two) times daily.   cholecalciferol 1000 units tablet Commonly known as: VITAMIN D Take 1,000 Units by mouth daily.   docusate sodium 100  MG capsule Commonly known as: Colace Take 1 capsule (100 mg total) by mouth daily as needed.   hydrochlorothiazide 25 MG tablet Commonly known as: HYDRODIURIL Take 25 mg by mouth daily.   PRELIEF PO Take 2 tablets by mouth daily with breakfast.   psyllium 58.6 % packet Commonly known as: METAMUCIL Take 1 packet by mouth daily.   sertraline 50 MG tablet Commonly known as: ZOLOFT Take 50 mg by mouth daily.   traMADol 50 MG tablet Commonly known as: ULTRAM Take 1 tablet (50 mg total) by mouth every 6 (six) hours as needed.            Durable Medical Equipment  (From admission, onward)         Start     Ordered   01/12/19 1114  DME Walker rolling  Once    Question:  Patient needs a walker to  treat with the following condition  Answer:  History of total hip replacement   01/12/19 1113   01/12/19 1114  DME 3 n 1  Once     01/12/19 1113   01/12/19 1114  DME Bedside commode  Once    Question:  Patient needs a bedside commode to treat with the following condition  Answer:  History of total hip replacement   01/12/19 1113          Diagnostic Studies: Dg Pelvis Portable  Result Date: 01/12/2019 CLINICAL DATA:  Left hip replacement EXAM: PORTABLE PELVIS 1-2 VIEWS COMPARISON:  Intraoperative imaging today FINDINGS: Changes of left hip replacement. Normal AP alignment. No hardware bony complicating feature. No acute bony abnormality. IMPRESSION: Left hip replacement.  No visible complicating feature. Electronically Signed   By: Rolm Baptise M.D.   On: 01/12/2019 13:02   Dg Hip Operative Unilat W Or W/o Pelvis Left  Result Date: 01/12/2019 CLINICAL DATA:  Left hip replacement EXAM: OPERATIVE LEFT HIP (WITH PELVIS IF PERFORMED) 7 VIEWS TECHNIQUE: Fluoroscopic spot image(s) were submitted for interpretation post-operatively. COMPARISON:  None. FINDINGS: Multiple intraoperative spot images demonstrate changes of left hip replacement. Normal AP alignment. No visible hardware or bony complicating feature. IMPRESSION: Left hip replacement.  No visible complicating feature. Electronically Signed   By: Rolm Baptise M.D.   On: 01/12/2019 13:02    Disposition: Discharge disposition: 01-Home or Self Care            Signed: Lovell Sheehan ,MD 01/13/2019, 12:23 PM

## 2019-08-19 ENCOUNTER — Ambulatory Visit: Payer: Medicare Other | Attending: Internal Medicine

## 2019-08-19 DIAGNOSIS — Z23 Encounter for immunization: Secondary | ICD-10-CM | POA: Insufficient documentation

## 2019-08-19 NOTE — Progress Notes (Signed)
   Covid-19 Vaccination Clinic  Name:  Michele Dean    MRN: [id number] DOB: [birth year]  08/19/2019  [name] was observed post Covid-19 immunization for 15 minutes without incidence. She was provided with Vaccine Information Sheet and instruction to access the V-Safe system.   [name] was instructed to call 911 with any severe reactions post vaccine: Marland Kitchen Difficulty breathing  . Swelling of your face and throat  . A fast heartbeat  . A bad rash all over your body  . Dizziness and weakness    Immunizations Administered    Name Date Dose VIS Date Route   Pfizer COVID-19 Vaccine 08/19/2019 10:09 AM 0.3 mL 06/03/2019 Intramuscular   Manufacturer: [place]   Lot: [id number]   [place]: [id number]

## 2019-09-14 ENCOUNTER — Ambulatory Visit: Payer: Medicare Other | Attending: Internal Medicine

## 2019-09-14 DIAGNOSIS — Z23 Encounter for immunization: Secondary | ICD-10-CM

## 2019-09-14 NOTE — Progress Notes (Signed)
   Covid-19 Vaccination Clinic  Name:  Michele Dean    MRN: [id number] DOB: 08-26-1945  09/14/2019  [name] was observed post Covid-19 immunization for 15 minutes without incident. She was provided with Vaccine Information Sheet and instruction to access the V-Safe system.   [name] was instructed to call 911 with any severe reactions post vaccine: Marland Kitchen Difficulty breathing  . Swelling of face and throat  . A fast heartbeat  . A bad rash all over body  . Dizziness and weakness   Immunizations Administered    Name Date Dose VIS Date Route   Pfizer COVID-19 Vaccine 09/14/2019  2:38 PM 0.3 mL 06/03/2019 Intramuscular   Manufacturer: [place]   Lot: [id number]   [place]: [id number]

## 2019-12-22 ENCOUNTER — Other Ambulatory Visit: Payer: Self-pay | Admitting: Internal Medicine

## 2019-12-22 DIAGNOSIS — Z1231 Encounter for screening mammogram for malignant neoplasm of breast: Secondary | ICD-10-CM

## 2020-01-27 ENCOUNTER — Ambulatory Visit
Admission: RE | Admit: 2020-01-27 | Discharge: 2020-01-27 | Disposition: A | Discharge status: Home / Self Care | Payer: Medicare Other | Source: Ambulatory Visit | Attending: Internal Medicine | Admitting: Internal Medicine

## 2020-01-27 ENCOUNTER — Other Ambulatory Visit: Payer: Self-pay

## 2020-01-27 DIAGNOSIS — Z1231 Encounter for screening mammogram for malignant neoplasm of breast: Secondary | ICD-10-CM | POA: Diagnosis not present

## 2020-12-27 ENCOUNTER — Other Ambulatory Visit: Payer: Self-pay | Admitting: Internal Medicine

## 2020-12-27 DIAGNOSIS — Z1231 Encounter for screening mammogram for malignant neoplasm of breast: Secondary | ICD-10-CM

## 2021-01-29 ENCOUNTER — Ambulatory Visit
Admission: RE | Admit: 2021-01-29 | Discharge: 2021-01-29 | Disposition: A | Discharge status: Home / Self Care | Payer: Medicare Other | Source: Ambulatory Visit | Attending: Internal Medicine | Admitting: Internal Medicine

## 2021-01-29 ENCOUNTER — Other Ambulatory Visit: Payer: Self-pay

## 2021-01-29 DIAGNOSIS — Z1231 Encounter for screening mammogram for malignant neoplasm of breast: Secondary | ICD-10-CM | POA: Diagnosis not present

## 2022-01-02 ENCOUNTER — Other Ambulatory Visit: Payer: Self-pay | Admitting: Internal Medicine

## 2022-01-02 DIAGNOSIS — Z1231 Encounter for screening mammogram for malignant neoplasm of breast: Secondary | ICD-10-CM

## 2022-02-25 ENCOUNTER — Ambulatory Visit
Admission: RE | Admit: 2022-02-25 | Discharge: 2022-02-25 | Disposition: A | Discharge status: Home / Self Care | Payer: Medicare Other | Source: Ambulatory Visit | Attending: Internal Medicine | Admitting: Internal Medicine

## 2022-02-25 DIAGNOSIS — Z1231 Encounter for screening mammogram for malignant neoplasm of breast: Secondary | ICD-10-CM | POA: Insufficient documentation

## 2024-01-29 ENCOUNTER — Ambulatory Visit
